# Patient Record
Sex: Female | Born: 1950 | Race: White | Hispanic: No | Smoking: Never smoker
Health system: Southern US, Community
[De-identification: ages and names within clinical notes are randomized; demographics above are authoritative.]

## PROBLEM LIST (undated history)

## (undated) DIAGNOSIS — H269 Unspecified cataract: Secondary | ICD-10-CM

## (undated) DIAGNOSIS — G610 Guillain-Barre syndrome: Secondary | ICD-10-CM

## (undated) DIAGNOSIS — R569 Unspecified convulsions: Secondary | ICD-10-CM

## (undated) DIAGNOSIS — F0390 Unspecified dementia without behavioral disturbance: Secondary | ICD-10-CM

## (undated) DIAGNOSIS — F419 Anxiety disorder, unspecified: Secondary | ICD-10-CM

## (undated) HISTORY — PX: EYE SURGERY: SHX253

## (undated) HISTORY — PX: TUBAL LIGATION: SHX77

## (undated) HISTORY — DX: Anxiety disorder, unspecified: F41.9

## (undated) HISTORY — DX: Unspecified convulsions: R56.9

## (undated) HISTORY — DX: Guillain-Barre syndrome: G61.0

## (undated) HISTORY — DX: Unspecified cataract: H26.9

## (undated) HISTORY — PX: HIP SURGERY: SHX245

---

## 2020-04-16 ENCOUNTER — Other Ambulatory Visit: Payer: Self-pay

## 2020-04-17 ENCOUNTER — Ambulatory Visit (INDEPENDENT_AMBULATORY_CARE_PROVIDER_SITE_OTHER): Payer: Medicare Other | Admitting: Family Medicine

## 2020-04-17 ENCOUNTER — Encounter: Payer: Self-pay | Admitting: Family Medicine

## 2020-04-17 VITALS — BP 140/72 | HR 121 | Temp 98.0°F | Ht 64.0 in | Wt <= 1120 oz

## 2020-04-17 DIAGNOSIS — G309 Alzheimer's disease, unspecified: Secondary | ICD-10-CM | POA: Diagnosis not present

## 2020-04-17 DIAGNOSIS — F419 Anxiety disorder, unspecified: Secondary | ICD-10-CM | POA: Diagnosis not present

## 2020-04-17 DIAGNOSIS — G610 Guillain-Barre syndrome: Secondary | ICD-10-CM

## 2020-04-17 DIAGNOSIS — F0280 Dementia in other diseases classified elsewhere without behavioral disturbance: Secondary | ICD-10-CM

## 2020-04-17 DIAGNOSIS — I10 Essential (primary) hypertension: Secondary | ICD-10-CM

## 2020-04-17 DIAGNOSIS — R000 Tachycardia, unspecified: Secondary | ICD-10-CM

## 2020-04-17 DIAGNOSIS — G6181 Chronic inflammatory demyelinating polyneuritis: Secondary | ICD-10-CM | POA: Insufficient documentation

## 2020-04-17 DIAGNOSIS — G40909 Epilepsy, unspecified, not intractable, without status epilepticus: Secondary | ICD-10-CM

## 2020-04-17 MED ORDER — LISINOPRIL 10 MG PO TABS: 5.0000 mg | ORAL_TABLET | Freq: Every day | ORAL | 1 refills | Status: DC

## 2020-04-17 MED ORDER — PAROXETINE HCL 10 MG PO TABS: 10.0000 mg | ORAL_TABLET | Freq: Every day | ORAL | 1 refills | Status: DC

## 2020-04-17 NOTE — Progress Notes (Signed)
New Patient Office Visit  Subjective:  Patient ID: Gabrielle Berry, female    DOB: 12/28/1950  Age: 69 y.o. MRN: [id number]  CC:  Chief Complaint  Patient presents with  . Hospitalization Follow-up    follow up from hospital for seizures. Would like to start on anxiety medications.     HPI Gabrielle Berry presents for establishment of care.  Patient is from [facility] and was hospitalized for a seizure about 2 weeks ago.  She presents today with her daughter who has bought her up to this area to live with her.  She has a longstanding history of seizure disorder since childhood, diagnosed with Alzheimer's dementia 2 years ago.  History of mild hypertension.  History of anxiety.  Daughter says that her heart rate elevates when she is anxious.  Patient had been living independently with a significant other who is blind and has end-stage congestive heart failure.  Patient does not smoke, drink alcohol or use illicit drugs.  Patient had developed blood clots in her arms during her hospitalization and was discharged on 5 mg of apixaban twice daily for a month.  She has been taking donepezi donepezill 5mg  daily for 2 years.  She takes phenytoin 100 mg 3 times daily.  Daughter will be supervising patient's medication administration.  Daughter also assures me the patient has been eating normally and accepting Ensure.  Past Medical History:  Diagnosis Date  . Anxiety   . Cataract   . Seizures (HCC)     Past Surgical History:  Procedure Laterality Date  . EYE SURGERY    . HIP SURGERY Right   . TUBAL LIGATION      Family History  Problem Relation Age of Onset  . Hypertension Mother   . Anxiety disorder Mother   . Diabetes Father     Social History   Socioeconomic History  . Marital status: Widowed    Spouse name: Not on file  . Number of children: Not on file  . Years of education: Not on file  . Highest education level: Not on file  Occupational History  . Not on file  Tobacco Use    . Smoking status: Never Smoker  . Smokeless tobacco: Never Used  Substance and Sexual Activity  . Alcohol use: Never  . Drug use: Never  . Sexual activity: Not on file  Other Topics Concern  . Not on file  Social History Narrative  . Not on file   Social Determinants of Health   Financial Resource Strain:   . Difficulty of Paying Living Expenses:   Food Insecurity:   . Worried About in the Last Year:   . Programme researcher, broadcasting/film/video in the Last Year:   Transportation Needs:   . Barista (Medical):   Freight forwarder Lack of Transportation (Non-Medical):   Physical Activity:   . Days of Exercise per Week:   . Minutes of Exercise per Session:   Stress:   . Feeling of Stress :   Social Connections:   . Frequency of Communication with Friends and Family:   . Frequency of Social Gatherings with Friends and Family:   . Attends Religious Services:   . Active Member of Clubs or Organizations:   . Attends Marland Kitchen Meetings:   Banker Marital Status:   Intimate Partner Violence:   . Fear of Current or Ex-Partner:   . Emotionally Abused:   Marland Kitchen Physically Abused:   . Sexually Abused:  ROS Review of Systems  Constitutional: Negative for chills, diaphoresis, fatigue, fever and unexpected weight change.  HENT: Negative.   Eyes: Negative for photophobia and visual disturbance.  Respiratory: Negative for shortness of breath and wheezing.   Cardiovascular: Negative.   Gastrointestinal: Negative.   Genitourinary: Negative.   Musculoskeletal: Positive for gait problem.  Skin: Negative for pallor and rash.  Allergic/Immunologic: Negative for immunocompromised state.  Neurological: Positive for seizures. Negative for speech difficulty.  Hematological: Does not bruise/bleed easily.  Psychiatric/Behavioral: Positive for confusion. Negative for behavioral problems. The patient is nervous/anxious.     Objective:   Today's Vitals: BP 140/72   Pulse (!) 121   Temp 98  F (36.7 C) (Tympanic)   Ht 5\' 4"  (1.626 m)   Wt 70 lb (31.8 kg)   LMP  (LMP Unknown)   SpO2 98%   BMI 12.02 kg/m   Physical Exam Vitals and nursing note reviewed.  Constitutional:      General: She is not in acute distress.    Appearance: She is underweight. She is ill-appearing. She is not toxic-appearing or diaphoretic.  HENT:     Head: Atraumatic.     Right Ear: External ear normal.     Left Ear: External ear normal.  Eyes:     General: No scleral icterus.       Right eye: No discharge.        Left eye: No discharge.     Conjunctiva/sclera: Conjunctivae normal.  Cardiovascular:     Rate and Rhythm: Regular rhythm. Tachycardia present.  Pulmonary:     Effort: Pulmonary effort is normal.     Breath sounds: Normal breath sounds.  Abdominal:     General: Bowel sounds are normal.  Musculoskeletal:     Cervical back: No rigidity or tenderness.     Right lower leg: No edema.     Left lower leg: No edema.  Lymphadenopathy:     Cervical: No cervical adenopathy.  Skin:    Coloration: Skin is not jaundiced.  Neurological:     Mental Status: She is alert.  Psychiatric:        Mood and Affect: Mood normal.        Behavior: Behavior normal.     Assessment & Plan:   Problem List Items Addressed This Visit    None    Visit Diagnoses    Seizure disorder (HCC)    -  Primary   Relevant Orders   CBC   Comprehensive metabolic panel   Dilantin (Phenytoin) level, total   Alzheimer's dementia without behavioral disturbance, unspecified timing of dementia onset (HCC)       Relevant Medications   PARoxetine (PAXIL) 10 MG tablet   Anxiety       Relevant Medications   PARoxetine (PAXIL) 10 MG tablet   Essential hypertension       Relevant Medications   PARoxetine (PAXIL) 10 MG tablet   lisinopril (PRINIVIL) 10 MG tablet   Other Relevant Orders   CBC   Comprehensive metabolic panel   Tachycardia       Relevant Orders   TSH      Outpatient Encounter Medications as of  04/17/2020  Medication Sig  . lisinopril (PRINIVIL) 10 MG tablet Take 0.5 tablets (5 mg total) by mouth daily.  06/17/2020 PARoxetine (PAXIL) 10 MG tablet Take 1 tablet (10 mg total) by mouth daily.   No facility-administered encounter medications on file as of 04/17/2020.    Follow-up: Return in  about 1 month (around 05/18/2020).   Libby Maw, MD

## 2020-04-17 NOTE — Addendum Note (Signed)
Addended by: Varney Biles on: 04/17/2020 01:40 PM   Modules accepted: Orders

## 2020-04-23 ENCOUNTER — Other Ambulatory Visit: Payer: Self-pay

## 2020-04-23 ENCOUNTER — Encounter: Payer: Self-pay | Admitting: Family

## 2020-04-23 ENCOUNTER — Ambulatory Visit (INDEPENDENT_AMBULATORY_CARE_PROVIDER_SITE_OTHER): Payer: Medicare Other | Admitting: Family

## 2020-04-23 VITALS — BP 160/80 | HR 97 | Temp 98.2°F | Ht 64.0 in | Wt <= 1120 oz

## 2020-04-23 DIAGNOSIS — L03113 Cellulitis of right upper limb: Secondary | ICD-10-CM | POA: Diagnosis not present

## 2020-04-23 MED ORDER — SULFAMETHOXAZOLE-TRIMETHOPRIM 800-160 MG PO TABS
1.0000 | ORAL_TABLET | Freq: Two times a day (BID) | ORAL | 0 refills | Status: DC
Start: 2020-04-23 — End: 2020-05-08

## 2020-04-23 NOTE — Patient Instructions (Signed)

## 2020-04-23 NOTE — Progress Notes (Signed)
Acute Office Visit  Subjective:    Patient ID: Gabrielle Berry, female    DOB: February 12, 1951, 69 y.o.   MRN: [id number]  Chief Complaint  Patient presents with  . Arm Pain    Pt c/o pain in her rt arm.  She recently had an IV in that arm.  Sore to touch, daughter noticed last night.    HPI Patient is in today with c/o redness, swelling and warmth to the right arm where she had an IV in the hospital. She spent 2 weeks in the hospital. Home Health suggested she be taken to the doctor to have it checked. Her daughter has been applying Bacitracin to the area. It has become sore to touch.   Past Medical History:  Diagnosis Date  . Anxiety   . Cataract   . Seizures (HCC)     Past Surgical History:  Procedure Laterality Date  . EYE SURGERY    . HIP SURGERY Right   . TUBAL LIGATION      Family History  Problem Relation Age of Onset  . Hypertension Mother   . Anxiety disorder Mother   . Diabetes Father     Social History   Socioeconomic History  . Marital status: Widowed    Spouse name: Not on file  . Number of children: Not on file  . Years of education: Not on file  . Highest education level: Not on file  Occupational History  . Not on file  Tobacco Use  . Smoking status: Never Smoker  . Smokeless tobacco: Never Used  Substance and Sexual Activity  . Alcohol use: Never  . Drug use: Never  . Sexual activity: Not on file  Other Topics Concern  . Not on file  Social History Narrative  . Not on file   Social Determinants of Health   Financial Resource Strain:   . Difficulty of Paying Living Expenses:   Food Insecurity:   . Worried About Programme researcher, broadcasting/film/video in the Last Year:   . Barista in the Last Year:   Transportation Needs:   . Freight forwarder (Medical):   Marland Kitchen Lack of Transportation (Non-Medical):   Physical Activity:   . Days of Exercise per Week:   . Minutes of Exercise per Session:   Stress:   . Feeling of Stress :   Social Connections:     . Frequency of Communication with Friends and Family:   . Frequency of Social Gatherings with Friends and Family:   . Attends Religious Services:   . Active Member of Clubs or Organizations:   . Attends Banker Meetings:   Marland Kitchen Marital Status:   Intimate Partner Violence:   . Fear of Current or Ex-Partner:   . Emotionally Abused:   Marland Kitchen Physically Abused:   . Sexually Abused:     Outpatient Medications Prior to Visit  Medication Sig Dispense Refill  . apixaban (ELIQUIS) 5 MG TABS tablet Take 5 mg by mouth 2 (two) times daily.    Marland Kitchen donepezil (ARICEPT) 5 MG tablet Take 5 mg by mouth at bedtime.    Marland Kitchen lisinopril (PRINIVIL) 10 MG tablet Take 0.5 tablets (5 mg total) by mouth daily. 30 tablet 1  . PARoxetine (PAXIL) 10 MG tablet Take 1 tablet (10 mg total) by mouth daily. 30 tablet 1  . phenytoin (DILANTIN) 100 MG ER capsule Take by mouth 3 (three) times daily.     No facility-administered medications prior to visit.  Allergies  Allergen Reactions  . Penicillins     Review of Systems  Constitutional: Negative.   Respiratory: Negative.   Cardiovascular: Negative.   Skin: Positive for wound.       Wound to the right arm that is swollen, red, and tender to touch  Psychiatric/Behavioral: Negative.        Objective:    Physical Exam Vitals reviewed.  Constitutional:      Comments: underweight  Cardiovascular:     Rate and Rhythm: Normal rate and regular rhythm.  Pulmonary:     Effort: Pulmonary effort is normal.     Breath sounds: Normal breath sounds.  Skin:      Neurological:     Mental Status: She is alert.     BP (!) 160/80 (BP Location: Left Arm, Patient Position: Sitting, Cuff Size: Small)   Pulse 97   Temp 98.2 F (36.8 C) (Temporal)   Ht 5\' 4"  (1.626 m)   Wt 68 lb 9.6 oz (31.1 kg)   LMP  (LMP Unknown)   SpO2 97%   BMI 11.78 kg/m  Wt Readings from Last 3 Encounters:  04/23/20 68 lb 9.6 oz (31.1 kg)  04/17/20 70 lb (31.8 kg)    Health  Maintenance Due  Topic Date Due  . Hepatitis C Screening  Never done  . COVID-19 Vaccine (1) Never done  . TETANUS/TDAP  Never done  . MAMMOGRAM  Never done  . COLONOSCOPY  Never done  . DEXA SCAN  Never done  . PNA vac Low Risk Adult (1 of 2 - PCV13) Never done        Assessment & Plan:   Problem List Items Addressed This Visit    None    Visit Diagnoses    Cellulitis of right upper extremity    -  Primary      Gabrielle Berry was seen today for arm pain.  Diagnoses and all orders for this visit:  Cellulitis of right upper extremity  Other orders -     sulfamethoxazole-trimethoprim (BACTRIM DS) 800-160 MG tablet; Take 1 tablet by mouth 2 (two) times daily.  Call the office with any questions or concerns. Recheck as scheduled and sooner as needed   Kennyth Arnold, FNP

## 2020-04-24 ENCOUNTER — Telehealth: Payer: Self-pay | Admitting: Family Medicine

## 2020-04-24 NOTE — Telephone Encounter (Signed)
Dr. Doreene Burke is it okay for verbal orders for wound care a home nurse calling from [facility]? Please advise.

## 2020-04-24 NOTE — Telephone Encounter (Signed)
Gabrielle Berry is calling and stated that she went to see patient yesterday and needed womb care orders and if Dr. Doreene Burke could give verbal for home health care. CB is [phone]

## 2020-04-25 NOTE — Telephone Encounter (Signed)
Okay wound care.

## 2020-04-25 NOTE — Telephone Encounter (Signed)
Returned Angel's call for verbal orders, no answer LMTCB

## 2020-04-30 NOTE — Telephone Encounter (Signed)
Tried [facility] manager regarding orders for patient, no answer LMTCB

## 2020-05-08 ENCOUNTER — Telehealth: Payer: Self-pay | Admitting: Family Medicine

## 2020-05-08 MED ORDER — SULFAMETHOXAZOLE-TRIMETHOPRIM 800-160 MG PO TABS: 1.0000 | ORAL_TABLET | Freq: Two times a day (BID) | ORAL | 0 refills | Status: DC

## 2020-05-08 NOTE — Telephone Encounter (Signed)
Refill sent in called daughter to inform.

## 2020-05-08 NOTE — Telephone Encounter (Signed)
Patient's daughter called and said the infection on her arm has gotten better but not quite cleared up. She is requesting a refill of the Bactrim antibiotics.

## 2020-05-11 ENCOUNTER — Telehealth: Payer: Self-pay | Admitting: Family Medicine

## 2020-05-11 NOTE — Telephone Encounter (Addendum)
Patient daughter is calling and requesting a refill for donepezil sent to [facility] on [address]  CB is [phone]

## 2020-05-15 ENCOUNTER — Other Ambulatory Visit: Payer: Self-pay

## 2020-05-15 MED ORDER — DONEPEZIL HCL 5 MG PO TABS: 5.0000 mg | ORAL_TABLET | Freq: Every day | ORAL | 2 refills | Status: DC

## 2020-05-15 NOTE — Telephone Encounter (Signed)
Last OV 04/23/20 Last fill Historical Provider

## 2020-05-15 NOTE — Telephone Encounter (Signed)
Request sent to Dr. Kremer. 

## 2020-05-16 NOTE — Telephone Encounter (Signed)
Patient daughter is calling back to check the status of medication refill. CB is [phone].

## 2020-05-17 ENCOUNTER — Other Ambulatory Visit: Payer: Self-pay

## 2020-05-17 NOTE — Telephone Encounter (Signed)
Spoke with patients daughter who verbally understood Rx sent to pharmacy.

## 2020-05-18 ENCOUNTER — Ambulatory Visit (INDEPENDENT_AMBULATORY_CARE_PROVIDER_SITE_OTHER): Payer: Medicare Other | Admitting: Family Medicine

## 2020-05-18 ENCOUNTER — Encounter: Payer: Self-pay | Admitting: Family Medicine

## 2020-05-18 VITALS — BP 124/68 | HR 75 | Temp 97.6°F | Ht 64.0 in | Wt <= 1120 oz

## 2020-05-18 DIAGNOSIS — R000 Tachycardia, unspecified: Secondary | ICD-10-CM

## 2020-05-18 DIAGNOSIS — F0280 Dementia in other diseases classified elsewhere without behavioral disturbance: Secondary | ICD-10-CM

## 2020-05-18 DIAGNOSIS — G40909 Epilepsy, unspecified, not intractable, without status epilepticus: Secondary | ICD-10-CM

## 2020-05-18 DIAGNOSIS — I10 Essential (primary) hypertension: Secondary | ICD-10-CM | POA: Diagnosis not present

## 2020-05-18 DIAGNOSIS — R569 Unspecified convulsions: Secondary | ICD-10-CM | POA: Insufficient documentation

## 2020-05-18 DIAGNOSIS — G309 Alzheimer's disease, unspecified: Secondary | ICD-10-CM | POA: Insufficient documentation

## 2020-05-18 DIAGNOSIS — G610 Guillain-Barre syndrome: Secondary | ICD-10-CM

## 2020-05-18 DIAGNOSIS — Z8669 Personal history of other diseases of the nervous system and sense organs: Secondary | ICD-10-CM

## 2020-05-18 DIAGNOSIS — F419 Anxiety disorder, unspecified: Secondary | ICD-10-CM

## 2020-05-18 DIAGNOSIS — F02818 Alzheimer's disease, unspecified: Secondary | ICD-10-CM | POA: Insufficient documentation

## 2020-05-18 MED ORDER — PAROXETINE HCL 10 MG PO TABS: 10.0000 mg | ORAL_TABLET | Freq: Every day | ORAL | 1 refills | Status: DC

## 2020-05-18 MED ORDER — LISINOPRIL 10 MG PO TABS: 5.0000 mg | ORAL_TABLET | Freq: Every day | ORAL | 1 refills | Status: DC

## 2020-05-18 NOTE — Addendum Note (Signed)
Addended by: Varney Biles on: 05/18/2020 12:01 PM   Modules accepted: Orders

## 2020-05-18 NOTE — Patient Instructions (Addendum)

## 2020-05-18 NOTE — Addendum Note (Signed)
Addended by: Varney Biles on: 05/18/2020 11:46 AM   Modules accepted: Orders

## 2020-05-18 NOTE — Progress Notes (Signed)
Established Patient Office Visit  Subjective:  Patient ID: Gabrielle Berry, female    DOB: 1951/10/03  Age: 69 y.o. MRN: [id number]  CC:  Chief Complaint  Patient presents with   Follow-up    follow up would like referral to neurogist guillain barre    HPI Gabrielle Berry presents for follow-up.  Doing better.  Excepting increased nutrition.  Energy level is improved.  Using wheelchair for long distances only.  Anxiety is improved with the Paxil.  Has finished her course of therapy with Eliquis.  Past Medical History:  Diagnosis Date   Anxiety    Cataract    Guillain Barr syndrome ([place])    Seizures ([place])     Past Surgical History:  Procedure Laterality Date   EYE SURGERY     HIP SURGERY Right    TUBAL LIGATION      Family History  Problem Relation Age of Onset   Hypertension Mother    Anxiety disorder Mother    Diabetes Father     Social History   Socioeconomic History   Marital status: Widowed    Spouse name: Not on file   Number of children: Not on file   Years of education: Not on file   Highest education level: Not on file  Occupational History   Not on file  Tobacco Use   Smoking status: Never Smoker   Smokeless tobacco: Never Used  Substance and Sexual Activity   Alcohol use: Never   Drug use: Never   Sexual activity: Not on file  Other Topics Concern   Not on file  Social History Narrative   Not on file   Social Determinants of Health   Financial Resource Strain:    Difficulty of Paying Living Expenses:   Food Insecurity:    Worried About [place] in the Last Year:    Arboriculturist in the Last Year:   Transportation Needs:    Film/video editor (Medical):    Lack of Transportation (Non-Medical):   Physical Activity:    Days of Exercise per Week:    Minutes of Exercise per Session:   Stress:    Feeling of Stress :   Social Connections:    Frequency of Communication with Friends and Family:      Frequency of Social Gatherings with Friends and Family:    Attends Religious Services:    Active Member of Clubs or Organizations:    Attends Music therapist:    Marital Status:   Intimate Partner Violence:    Fear of Current or Ex-Partner:    Emotionally Abused:    Physically Abused:    Sexually Abused:     Outpatient Medications Prior to Visit  Medication Sig Dispense Refill   donepezil (ARICEPT) 5 MG tablet Take 1 tablet (5 mg total) by mouth at bedtime. 30 tablet 2   phenytoin (DILANTIN) 100 MG ER capsule Take by mouth 3 (three) times daily.     lisinopril (PRINIVIL) 10 MG tablet Take 0.5 tablets (5 mg total) by mouth daily. 30 tablet 1   PARoxetine (PAXIL) 10 MG tablet Take 1 tablet (10 mg total) by mouth daily. 30 tablet 1   apixaban (ELIQUIS) 5 MG TABS tablet Take 5 mg by mouth 2 (two) times daily.     sulfamethoxazole-trimethoprim (BACTRIM DS) 800-160 MG tablet Take 1 tablet by mouth 2 (two) times daily. (Patient not taking: Reported on 05/18/2020) 14 tablet 0   No facility-administered  medications prior to visit.    Allergies  Allergen Reactions   Penicillins     ROS Review of Systems  Constitutional: Negative.   Respiratory: Negative.   Cardiovascular: Negative.   Gastrointestinal: Negative.   Genitourinary: Negative.   Neurological: Negative for speech difficulty.  Hematological: Does not bruise/bleed easily.  Psychiatric/Behavioral: Positive for confusion.      Objective:    Physical Exam  Constitutional: She appears cachectic.  HENT:  Head: Normocephalic and atraumatic.  Right Ear: External ear normal.  Left Ear: External ear normal.  Eyes: Pupils are equal, round, and reactive to light. Conjunctivae and EOM are normal.  Neck: Trachea normal.  Cardiovascular: Regular rhythm.  Murmur heard.  Systolic murmur is present. Pulmonary/Chest: Effort normal and breath sounds normal.  Musculoskeletal:     Cervical back: Neck  supple.     Right lower leg: No edema.     Left lower leg: No edema.  Neurological: She is alert.  Oriented to person     BP 124/68    Pulse 75    Temp 97.6 F (36.4 C) (Tympanic)    Ht 5' 4" (1.626 m)    Wt 69 lb 11.2 oz (31.6 kg)    LMP  (LMP Unknown)    SpO2 94%    BMI 11.96 kg/m  Wt Readings from Last 3 Encounters:  05/18/20 69 lb 11.2 oz (31.6 kg)  04/23/20 68 lb 9.6 oz (31.1 kg)  04/17/20 70 lb (31.8 kg)     Health Maintenance Due  Topic Date Due   Hepatitis C Screening  Never done   COVID-19 Vaccine (1) Never done   TETANUS/TDAP  Never done   MAMMOGRAM  Never done   COLONOSCOPY  Never done   DEXA SCAN  Never done   PNA vac Low Risk Adult (1 of 2 - PCV13) Never done    There are no preventive care reminders to display for this patient.  No results found for: TSH No results found for: WBC, HGB, HCT, MCV, PLT No results found for: NA, K, CHLORIDE, CO2, GLUCOSE, BUN, CREATININE, BILITOT, ALKPHOS, AST, ALT, PROT, ALBUMIN, CALCIUM, ANIONGAP, EGFR, GFR No results found for: CHOL No results found for: HDL No results found for: LDLCALC No results found for: TRIG No results found for: CHOLHDL No results found for: HGBA1C    Assessment & Plan:   Problem List Items Addressed This Visit      Cardiovascular and Mediastinum   Essential hypertension   Relevant Medications   lisinopril (PRINIVIL) 10 MG tablet     Nervous and Auditory   Seizure disorder ([place]) - Primary   Relevant Orders   Ambulatory referral to Neurology   Alzheimer's dementia without behavioral disturbance ([place])   Relevant Medications   PARoxetine (PAXIL) 10 MG tablet   Other Relevant Orders   Ambulatory referral to Neurology     Other   Anxiety   Relevant Medications   PARoxetine (PAXIL) 10 MG tablet   History of Guillain-Barre syndrome   Relevant Orders   Ambulatory referral to Neurology      Meds ordered this encounter  Medications   lisinopril (PRINIVIL) 10 MG tablet    Sig:  Take 0.5 tablets (5 mg total) by mouth daily.    Dispense:  90 tablet    Refill:  1   PARoxetine (PAXIL) 10 MG tablet    Sig: Take 1 tablet (10 mg total) by mouth daily.    Dispense:  90 tablet  Refill:  1  ° ° °Follow-up: Return in about 3 months (around 08/18/2020), or try to gain some weight..  °Has a to increase Aricept today as patient continues to try to gain some point.  Was given information on high-protein high-calorie diet that can help her. ° ° Alfred , MD °

## 2020-05-22 ENCOUNTER — Encounter: Payer: Self-pay | Admitting: Neurology

## 2020-06-11 ENCOUNTER — Other Ambulatory Visit: Payer: Self-pay | Admitting: Family Medicine

## 2020-06-11 MED ORDER — PHENYTOIN SODIUM EXTENDED 100 MG PO CAPS: 100.0000 mg | ORAL_CAPSULE | Freq: Three times a day (TID) | ORAL | 0 refills | Status: DC

## 2020-06-11 NOTE — Telephone Encounter (Signed)
Patient needs refill of Dilantin, please send to same [facility] in [place].

## 2020-06-11 NOTE — Telephone Encounter (Signed)
Refill request for pending medication, last visit 05/18/20. Please advise.

## 2020-06-12 ENCOUNTER — Telehealth: Payer: Self-pay | Admitting: Family Medicine

## 2020-06-12 NOTE — Telephone Encounter (Signed)
Attempted to schedule AWV. Unable to LVM.  Will try at later time.  

## 2020-06-27 ENCOUNTER — Other Ambulatory Visit: Payer: Self-pay

## 2020-06-27 ENCOUNTER — Other Ambulatory Visit (INDEPENDENT_AMBULATORY_CARE_PROVIDER_SITE_OTHER): Payer: Medicare Other

## 2020-06-27 DIAGNOSIS — F419 Anxiety disorder, unspecified: Secondary | ICD-10-CM | POA: Diagnosis not present

## 2020-06-27 DIAGNOSIS — F0280 Dementia in other diseases classified elsewhere without behavioral disturbance: Secondary | ICD-10-CM

## 2020-06-27 DIAGNOSIS — R000 Tachycardia, unspecified: Secondary | ICD-10-CM

## 2020-06-27 DIAGNOSIS — G610 Guillain-Barre syndrome: Secondary | ICD-10-CM

## 2020-06-27 DIAGNOSIS — G309 Alzheimer's disease, unspecified: Secondary | ICD-10-CM

## 2020-06-27 DIAGNOSIS — Z8669 Personal history of other diseases of the nervous system and sense organs: Secondary | ICD-10-CM

## 2020-06-27 DIAGNOSIS — G40909 Epilepsy, unspecified, not intractable, without status epilepticus: Secondary | ICD-10-CM

## 2020-06-27 DIAGNOSIS — I10 Essential (primary) hypertension: Secondary | ICD-10-CM

## 2020-06-27 LAB — COMPREHENSIVE METABOLIC PANEL
ALT: 14 U/L (ref 0–35)
AST: 19 U/L (ref 0–37)
Albumin: 4.4 g/dL (ref 3.5–5.2)
Alkaline Phosphatase: 85 U/L (ref 39–117)
BUN: 14 mg/dL (ref 6–23)
CO2: 22 mEq/L (ref 19–32)
Calcium: 9.6 mg/dL (ref 8.4–10.5)
Chloride: 103 mEq/L (ref 96–112)
Creatinine, Ser: 0.82 mg/dL (ref 0.40–1.20)
GFR: 68.67 mL/min (ref >60.00)
Glucose, Bld: 99 mg/dL (ref 70–99)
Potassium: 4.2 mEq/L (ref 3.5–5.1)
Sodium: 138 mEq/L (ref 135–145)
Total Bilirubin: 0.4 mg/dL (ref 0.2–1.2)
Total Protein: 7.3 g/dL (ref 6.0–8.3)

## 2020-06-27 LAB — CBC
HCT: 41.1 % (ref 36.0–46.0)
Hemoglobin: 13.6 g/dL (ref 12.0–15.0)
MCHC: 33 g/dL (ref 30.0–36.0)
MCV: 99.5 fl (ref 78.0–100.0)
Platelets: 198 10*3/uL (ref 150.0–400.0)
RBC: 4.13 Mil/uL (ref 3.87–5.11)
RDW: 13.4 % (ref 11.5–15.5)
WBC: 6.9 10*3/uL (ref 4.0–10.5)

## 2020-06-27 LAB — TSH: TSH: 1.38 u[IU]/mL (ref 0.35–4.50)

## 2020-06-28 LAB — PHENYTOIN LEVEL, TOTAL: Phenytoin, Total: 5.6 mg/L — ABNORMAL LOW (ref 10.0–20.0)

## 2020-07-12 ENCOUNTER — Other Ambulatory Visit: Payer: Self-pay | Admitting: Family Medicine

## 2020-07-12 ENCOUNTER — Telehealth: Payer: Self-pay | Admitting: Family Medicine

## 2020-07-12 NOTE — Telephone Encounter (Signed)
Returned call no answer LMTCB 

## 2020-07-12 NOTE — Telephone Encounter (Signed)
Patient is calling back regarding patients labs, please advise. CB is [phone].

## 2020-08-13 ENCOUNTER — Other Ambulatory Visit: Payer: Self-pay | Admitting: Family Medicine

## 2020-08-27 ENCOUNTER — Ambulatory Visit (INDEPENDENT_AMBULATORY_CARE_PROVIDER_SITE_OTHER): Payer: Medicare Other | Admitting: Neurology

## 2020-08-27 ENCOUNTER — Other Ambulatory Visit: Payer: Self-pay

## 2020-08-27 ENCOUNTER — Encounter: Payer: Self-pay | Admitting: Neurology

## 2020-08-27 VITALS — BP 128/89 | HR 84 | Ht 64.0 in | Wt 74.6 lb

## 2020-08-27 DIAGNOSIS — F0280 Dementia in other diseases classified elsewhere without behavioral disturbance: Secondary | ICD-10-CM

## 2020-08-27 DIAGNOSIS — G301 Alzheimer's disease with late onset: Secondary | ICD-10-CM

## 2020-08-27 DIAGNOSIS — G40309 Generalized idiopathic epilepsy and epileptic syndromes, not intractable, without status epilepticus: Secondary | ICD-10-CM | POA: Diagnosis not present

## 2020-08-27 DIAGNOSIS — Z8669 Personal history of other diseases of the nervous system and sense organs: Secondary | ICD-10-CM

## 2020-08-27 MED ORDER — DONEPEZIL HCL 5 MG PO TABS: 5.0000 mg | ORAL_TABLET | Freq: Every day | ORAL | 3 refills | Status: DC

## 2020-08-27 MED ORDER — PHENYTOIN SODIUM EXTENDED 100 MG PO CAPS: ORAL_CAPSULE | ORAL | 3 refills | Status: DC

## 2020-08-27 NOTE — Progress Notes (Signed)
NEUROLOGY CONSULTATION NOTE  Gabrielle Berry MRN: [id number] DOB: [birth year]  Referring provider: Dr. Nadene Rubins Primary care provider: Dr. Nadene Rubins  Reason for consult:  Seizure disorder, Alzheimer's dementia, history of GBS  Dear Dr Doreene Burke:  Thank you for your kind referral of Gabrielle Berry for consultation of the above symptoms. Although her history is well known to you, please allow me to reiterate it for the purpose of our medical record. The patient was accompanied to the clinic by her daughter [name] who also provides collateral information. Records and images were personally reviewed where available.   HISTORY OF PRESENT ILLNESS: This is a pleasant 69 year old left-handed woman with a history of hypertension, dementia, epilepsy, recurrent Guillain-Barre syndrome, presenting to establish care. She moved to [place] to live with her daughter last May, after hospital admission for fever and seizure. She had been seizure-free since 1994 on Dilantin 300mg  qhs, until she had a seizure last May 2021 and was found to have non-existent Dilantin level. They found out she had not refilled it since January 2021. She had also stopped the Donepezil that time. She had been living alone in February 2021, with her nephew staying with her but not helping. She had a boyfriend she would visit daily, and denied getting lost driving to his home. He passed away recently. She was not missing bill payments, her daughter states she was not missing it because she knew she had to pay them, but when it came to taking care of herself, this was a different issue. She was forgetting to eat and 'was living off peanut butter and jelly sandwiches. She feels her memory is okay, but has word-finding difficulties. She was diagnosed with dementia around 2 years ago and has been taking Donepezil 5mg  daily without side effects. She has a history of seizures since childhood with generalized tonic-clonic seizures that were  well-controlled on Dilantin until last May. Her daughter denies any staring/unresponsive episodes. She denies any olfactory/gustatory hallucinations, myoclonic jerks. She has a history of 2 episodes of Guillain-Barre, the first occurred after a flu shot in 1997, she was intubated in [facility] and admitted for a month. She had another episode of GBS in 2000 when she started having constant itching in her feet, the diagnosis was confirmed and she received IV treatment and more physical therapy because she could not walk/use her hands or legs. She fractured her right leg at that time trying to ambulate. She has had chronic right leg pain since then. She cannot feel her feet and has paresthesias in her fingers. She denies any headaches. She gets dizzy walking sometimes. Her last fall was in April. She denies any diplopia, dysarthria/dysphagia, neck/back pain, bowel/bladder dysfunction, anosmia. She has tremors in both hands. Sleep is good. Since moving in with 2001, her daughter manages meals, finances, medications. She does not drive. She is independent with dressing and bathing. No personality changes. She had delirium in May and was hallucinating initially upon return to [facility], this has resolved. No family history of dementia. She does not drink alcohol.   Epilepsy Risk Factors:  There is a family history of seizures in her daughter (childhood seizures), granddaughter, and niece. She had febrile seizures in childhood. Otherwise she had a normal birth and early development.  There is no history of CNS infections such as meningitis/encephalitis, significant traumatic brain injury, neurosurgical procedures.   PAST MEDICAL HISTORY: Past Medical History:  Diagnosis Date  . Anxiety   . Cataract   .  Guillain Barr syndrome (HCC)   . Seizures (HCC)     PAST SURGICAL HISTORY: Past Surgical History:  Procedure Laterality Date  . EYE SURGERY    . HIP SURGERY Right   . TUBAL LIGATION       MEDICATIONS: Current Outpatient Medications on File Prior to Visit  Medication Sig Dispense Refill  . donepezil (ARICEPT) 5 MG tablet TAKE 1 TABLET (5 MG TOTAL) BY MOUTH AT BEDTIME. 30 tablet 2  . lisinopril (PRINIVIL) 10 MG tablet Take 0.5 tablets (5 mg total) by mouth daily. 90 tablet 1  . PARoxetine (PAXIL) 10 MG tablet Take 1 tablet (10 mg total) by mouth daily. 90 tablet 1  . phenytoin (DILANTIN) 100 MG ER capsule TAKE 1 CAPSULE BY MOUTH 3 TIMES DAILY. 90 capsule 0   No current facility-administered medications on file prior to visit.    ALLERGIES: Allergies  Allergen Reactions  . Penicillins     FAMILY HISTORY: Family History  Problem Relation Age of Onset  . Hypertension Mother   . Anxiety disorder Mother   . Diabetes Father     SOCIAL HISTORY: Social History   Socioeconomic History  . Marital status: Widowed    Spouse name: Not on file  . Number of children: Not on file  . Years of education: Not on file  . Highest education level: Not on file  Occupational History  . Not on file  Tobacco Use  . Smoking status: Never Smoker  . Smokeless tobacco: Never Used  Vaping Use  . Vaping Use: Never used  Substance and Sexual Activity  . Alcohol use: Never  . Drug use: Never  . Sexual activity: Not on file  Other Topics Concern  . Not on file  Social History Narrative   Left handed    Lives with daughter      Social Determinants of Health   Financial Resource Strain:   . Difficulty of Paying Living Expenses: Not on file  Food Insecurity:   . Worried About Programme researcher, broadcasting/film/video in the Last Year: Not on file  . Ran Out of Food in the Last Year: Not on file  Transportation Needs:   . Lack of Transportation (Medical): Not on file  . Lack of Transportation (Non-Medical): Not on file  Physical Activity:   . Days of Exercise per Week: Not on file  . Minutes of Exercise per Session: Not on file  Stress:   . Feeling of Stress : Not on file  Social  Connections:   . Frequency of Communication with Friends and Family: Not on file  . Frequency of Social Gatherings with Friends and Family: Not on file  . Attends Religious Services: Not on file  . Active Member of Clubs or Organizations: Not on file  . Attends Banker Meetings: Not on file  . Marital Status: Not on file  Intimate Partner Violence:   . Fear of Current or Ex-Partner: Not on file  . Emotionally Abused: Not on file  . Physically Abused: Not on file  . Sexually Abused: Not on file    PHYSICAL EXAM: Vitals:   08/27/20 0847  BP: 128/89  Pulse: 84  SpO2: 98%   General: No acute distress Head:  Normocephalic/atraumatic Skin/Extremities: No rash, no edema Neurological Exam: Mental status: alert and oriented to person, state. No dysarthria or aphasia, Fund of knowledge is appropriate.  Recent and remote memory are impaired.  Attention and concentration are reduced. SLUMS score 5/30 [employer]  Mental Exam 08/27/2020  Weekday Correct 0  Current year 0  What state are we in? 1  Amount spent 0  Amount left 0  # of Animals 0  5 objects recall 0  Number series 0  Hour markers 0  Time correct 0  Placed X in triangle correctly 1  Largest Figure 1  Name of female 2  Date back to work 0  Type of work 0  State she lived in 0  Total score 5   Cranial nerves: CN I: not tested CN II: pupils equal, round and reactive to light, visual fields intact CN III, IV, VI:  full range of motion, no nystagmus, no ptosis CN V: facial sensation intact CN VII: upper and lower face symmetric CN VIII: hearing intact to conversation Bulk & Tone: normal, no fasciculations. Motor: 5/5 on both UE, both proximal LE. 4-/5 bilateral foot dorsiflexion. Sensation: intact to light touch, cold, pin. Decreased vibration to right knee, left ankle.  Deep Tendon Reflexes: +2 throughout except for absent ankle jerks Cerebellar: no incoordination on finger to nose testing Gait:  steppage gait due to bilateral foot dorsiflexion Tremor: no resting tremor. She has bilateral high frequency low amplitude postural and endpoint tremor.   IMPRESSION: This is a pleasant 69 year old left-handed woman with a history of hypertension, dementia, epilepsy, recurrent Guillain-Barre syndrome, presenting to establish care. She has a history suggestive of primary generalized epilepsy, seizure-free from 52 until May 2021 due to medication non-compliance. She is back on Dilantin 300mg  daily, recent Dilantin level was subtherapeutic, however she has been on same dose for many years seizure-free until missing medication, would stay on same dose and re-check Dilantin level. Her neurological exam today shows bilateral mild foot drop, they report a history of recurrent GBS, records from her prior neurologist will be requested for review, advised to use walker at all times. SLUMS score today 5/30, she is unable to perform complex tasks independently and has 24/7 supervision. She is on Donepezil 5mg  daily, daughter feels she is stable on this dose, refills sent. She does not drive. Follow-up in 6 months, they know to call for any changes.   Thank you for allowing me to participate in the care of this patient. Please do not hesitate to call for any questions or concerns.   6/30, M.D.  CC: Dr. 

## 2020-08-27 NOTE — Patient Instructions (Signed)
1. Take the Dilantin 100mg : take 3 capsules every night  2. Continue Donepezil 5mg  daily  3. Records from Dr. will be requested for review  4. Have Dilantin level checked first thing in the morning when able  5. Follow-up in 6 months, call for any changes   Seizure Precautions: 1. If medication has been prescribed for you to prevent seizures, take it exactly as directed.  Do not stop taking the medicine without talking to your doctor first, even if you have not had a seizure in a long time.   2. Avoid activities in which a seizure would cause danger to yourself or to others.  Don't operate dangerous machinery, swim alone, or climb in high or dangerous places, such as on ladders, roofs, or girders.  Do not drive unless your doctor says you may.  3. If you have any warning that you may have a seizure, lay down in a safe place where you can't hurt yourself.    4.  No driving for 6 months from last seizure, as per [facility].   Please refer to the following link on the Epilepsy Foundation of America's website for more information: http://www.epilepsyfoundation.org/answerplace/Social/driving/drivingu.cfm   5.  Maintain good sleep hygiene. Avoid alcohol.  6.  Contact your doctor if you have any problems that may be related to the medicine you are taking.  7.  Call 911 and bring the patient back to the ED if:        A.  The seizure lasts longer than 5 minutes.       B.  The patient doesn't awaken shortly after the seizure  C.  The patient has new problems such as difficulty seeing, speaking or moving  D.  The patient was injured during the seizure  E.  The patient has a temperature over 102 F (39C)  F.  The patient vomited and now is having trouble breathing         FALL PRECAUTIONS: Be cautious when walking. Scan the area for obstacles that may increase the risk of trips and falls. When getting up in the mornings, sit up at the edge of the bed for a few minutes  before getting out of bed. Consider elevating the bed at the head end to avoid drop of blood pressure when getting up. Walk always in a well-lit room (use night lights in the walls). Avoid area rugs or power cords from appliances in the middle of the walkways. Use a walker or a cane if necessary and consider physical therapy for balance exercise. Get your eyesight checked regularly.   HOME SAFETY: Consider the safety of the kitchen when operating appliances like stoves, microwave oven, and blender. Consider having supervision and share cooking responsibilities until no longer able to participate in those. Accidents with firearms and other hazards in the house should be identified and addressed as well.   ABILITY TO BE LEFT ALONE: If patient is unable to contact 911 operator, consider using LifeLine, or when the need is there, arrange for someone to stay with patients. Smoking is a fire hazard, consider supervision or cessation. Risk of wandering should be assessed by caregiver and if detected at any point, supervision and safe proof recommendations should be instituted.   RECOMMENDATIONS FOR ALL PATIENTS WITH MEMORY PROBLEMS: 1. Continue to exercise (Recommend 30 minutes of walking everyday, or 3 hours every week) 2. Increase social interactions - continue going to [place] and enjoy social gatherings with friends and family 3.  Eat healthy, avoid fried foods and eat more fruits and vegetables 4. Maintain adequate blood pressure, blood sugar, and blood cholesterol level. Reducing the risk of stroke and cardiovascular disease also helps promoting better memory. 5. Avoid stressful situations. Live a simple life and avoid aggravations. Organize your time and prepare for the next day in anticipation. 6. Sleep well, avoid any interruptions of sleep and avoid any distractions in the bedroom that may interfere with adequate sleep quality 7. Avoid sugar, avoid sweets as there is a strong link between excessive  sugar intake, diabetes, and cognitive impairment The Mediterranean diet has been shown to help patients reduce the risk of progressive memory disorders and reduces cardiovascular risk. This includes eating fish, eat fruits and green leafy vegetables, nuts like almonds and hazelnuts, walnuts, and also use olive oil. Avoid fast foods and fried foods as much as possible. Avoid sweets and sugar as sugar use has been linked to worsening of memory function.  There is always a concern of gradual progression of memory problems. If this is the case, then we may need to adjust level of care according to patient needs. Support, both to the patient and caregiver, should then be put into place.

## 2020-09-12 ENCOUNTER — Telehealth: Payer: Self-pay | Admitting: Family Medicine

## 2020-09-12 ENCOUNTER — Telehealth: Payer: Self-pay | Admitting: Neurology

## 2020-09-12 NOTE — Telephone Encounter (Signed)
Records from her prior Neurologist Dr. 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 Had side effects on Keppra.

## 2020-09-12 NOTE — Telephone Encounter (Signed)
Left message for patient to schedule Annual Wellness Visit.  Please schedule with Nurse Health Advisor Martha Stanley, RN at [facility]  °

## 2020-09-18 ENCOUNTER — Ambulatory Visit: Payer: Medicare Other

## 2020-09-18 NOTE — Progress Notes (Deleted)
Subjective:   Gabrielle Berry is a 69 y.o. female who presents for an Initial Medicare Annual Wellness Visit.  Review of Systems    ***       Objective:    There were no vitals filed for this visit. There is no height or weight on file to calculate BMI.  Advanced Directives 08/27/2020  Does Patient Have a Medical Advance Directive? Yes  Type of Advance Directive Healthcare Power of Attorney    Current Medications (verified) Outpatient Encounter Medications as of 09/18/2020  Medication Sig  . donepezil (ARICEPT) 5 MG tablet Take 1 tablet (5 mg total) by mouth at bedtime.  Marland Kitchen lisinopril (PRINIVIL) 10 MG tablet Take 0.5 tablets (5 mg total) by mouth daily.  Marland Kitchen PARoxetine (PAXIL) 10 MG tablet Take 1 tablet (10 mg total) by mouth daily.  . phenytoin (DILANTIN) 100 MG ER capsule Take 3 capsules every night   No facility-administered encounter medications on file as of 09/18/2020.    Allergies (verified) Penicillins   History: Past Medical History:  Diagnosis Date  . Anxiety   . Cataract   . Guillain Barr syndrome (HCC)   . Seizures (HCC)    Past Surgical History:  Procedure Laterality Date  . EYE SURGERY    . HIP SURGERY Right   . TUBAL LIGATION     Family History  Problem Relation Age of Onset  . Hypertension Mother   . Anxiety disorder Mother   . Diabetes Father    Social History   Socioeconomic History  . Marital status: Widowed    Spouse name: Not on file  . Number of children: Not on file  . Years of education: Not on file  . Highest education level: Not on file  Occupational History  . Not on file  Tobacco Use  . Smoking status: Never Smoker  . Smokeless tobacco: Never Used  Vaping Use  . Vaping Use: Never used  Substance and Sexual Activity  . Alcohol use: Never  . Drug use: Never  . Sexual activity: Not on file  Other Topics Concern  . Not on file  Social History Narrative   Left handed    Lives with daughter      Social Determinants of  Health   Financial Resource Strain:   . Difficulty of Paying Living Expenses: Not on file  Food Insecurity:   . Worried About Programme researcher, broadcasting/film/video in the Last Year: Not on file  . Ran Out of Food in the Last Year: Not on file  Transportation Needs:   . Lack of Transportation (Medical): Not on file  . Lack of Transportation (Non-Medical): Not on file  Physical Activity:   . Days of Exercise per Week: Not on file  . Minutes of Exercise per Session: Not on file  Stress:   . Feeling of Stress : Not on file  Social Connections:   . Frequency of Communication with Friends and Family: Not on file  . Frequency of Social Gatherings with Friends and Family: Not on file  . Attends Religious Services: Not on file  . Active Member of Clubs or Organizations: Not on file  . Attends Banker Meetings: Not on file  . Marital Status: Not on file    Tobacco Counseling Counseling given: Not Answered   Clinical Intake:                 Diabetic?***         Activities of Daily Living  No flowsheet data found.  Patient Care Team: [name], MD as PCP - General (Family Medicine) Van Clines, MD as Consulting Physician (Neurology)  Indicate any recent Medical Services you may have received from other than [facility] providers in the past year (date may be approximate).     Assessment:   This is a routine wellness examination for [place].  Hearing/Vision screen No exam data present  Dietary issues and exercise activities discussed:    Goals   None    Depression Screen PHQ 2/9 Scores 04/17/2020  PHQ - 2 Score 1    Fall Risk Fall Risk  08/27/2020 04/17/2020  Falls in the past year? 1 1  Number falls in past yr: 1 0  Injury with Fall? 1 -  Risk for fall due to : Impaired balance/gait;Impaired mobility -    Any stairs in or around the home? {YES/NO:21197} If so, are there any without handrails? {YES/NO:21197} Home free of loose throw rugs in  walkways, pet beds, electrical cords, etc? {YES/NO:21197} Adequate lighting in your home to reduce risk of falls? {YES/NO:21197}  ASSISTIVE DEVICES UTILIZED TO PREVENT FALLS:  Life alert? {YES/NO:21197} Use of a cane, walker or w/c? {YES/NO:21197} Grab bars in the bathroom? {YES/NO:21197} Shower chair or bench in shower? {YES/NO:21197} Elevated toilet seat or a handicapped toilet? {YES/NO:21197}  TIMED UP AND GO:  Was the test performed? {YES/NO:21197}.  Length of time to ambulate 10 feet: *** sec.   {Appearance of UKGU:5427062}  Cognitive Function:        Immunizations  There is no immunization history on file for this patient.  {TDAP status:2101805} {Flu Vaccine status:2101806} {Pneumococcal vaccine status:2101807} {Covid-19 vaccine status:2101808}  Qualifies for Shingles Vaccine? {YES/NO:21197}  Zostavax completed {YES/NO:21197}  {Shingrix Completed?:2101804}  Screening Tests Health Maintenance  Topic Date Due  . Hepatitis C Screening  Never done  . COVID-19 Vaccine (1) Never done  . TETANUS/TDAP  Never done  . MAMMOGRAM  Never done  . COLONOSCOPY  Never done  . DEXA SCAN  Never done  . PNA vac Low Risk Adult (1 of 2 - PCV13) Never done  . INFLUENZA VACCINE  Never done    Health Maintenance  Health Maintenance Due  Topic Date Due  . Hepatitis C Screening  Never done  . COVID-19 Vaccine (1) Never done  . TETANUS/TDAP  Never done  . MAMMOGRAM  Never done  . COLONOSCOPY  Never done  . DEXA SCAN  Never done  . PNA vac Low Risk Adult (1 of 2 - PCV13) Never done  . INFLUENZA VACCINE  Never done    {Colorectal cancer screening:2101809} {Mammogram status:21018020} {Bone Density status:21018021}  Lung Cancer Screening: (Low Dose CT Chest recommended if Age 38-80 years, 30 pack-year currently smoking OR have quit w/in 15years.) {DOES NOT does:27190::"does not"} qualify.   Lung Cancer Screening Referral: ***  Additional Screening:  Hepatitis C  Screening: {DOES NOT does:27190::"does not"} qualify; Completed ***  Vision Screening: Recommended annual ophthalmology exams for early detection of glaucoma and other disorders of the eye. Is the patient up to date with their annual eye exam?  {YES/NO:21197} Who is the provider or what is the name of the office in which the patient attends annual eye exams? *** If pt is not established with a provider, would they like to be referred to a provider to establish care? {YES/NO:21197}.   Dental Screening: Recommended annual dental exams for proper oral hygiene  Community Resource Referral / Chronic Care Management: CRR required  this visit?  {YES/NO:21197}  CCM required this visit?  {YES/NO:21197}     Plan:     I have personally reviewed and noted the following in the patient's chart:   . Medical and social history . Use of alcohol, tobacco or illicit drugs  . Current medications and supplements . Functional ability and status . Nutritional status . Physical activity . Advanced directives . List of other physicians . Hospitalizations, surgeries, and ER visits in previous 12 months . Vitals . Screenings to include cognitive, depression, and falls . Referrals and appointments  In addition, I have reviewed and discussed with patient certain preventive protocols, quality metrics, and best practice recommendations. A written personalized care plan for preventive services as well as general preventive health recommendations were provided to patient.     Roanna Raider, LPN   27/01/5365   Nurse Notes: ***

## 2020-10-02 ENCOUNTER — Ambulatory Visit (INDEPENDENT_AMBULATORY_CARE_PROVIDER_SITE_OTHER): Payer: Medicare Other

## 2020-10-02 ENCOUNTER — Other Ambulatory Visit: Payer: Self-pay

## 2020-10-02 VITALS — Temp 96.7°F | Ht 64.0 in | Wt 75.8 lb

## 2020-10-02 DIAGNOSIS — Z0000 Encounter for general adult medical examination without abnormal findings: Secondary | ICD-10-CM | POA: Diagnosis not present

## 2020-10-02 DIAGNOSIS — Z1231 Encounter for screening mammogram for malignant neoplasm of breast: Secondary | ICD-10-CM | POA: Diagnosis not present

## 2020-10-02 NOTE — Progress Notes (Signed)
Subjective:   Gabrielle Berry is a 69 y.o. female who presents for an Initial Medicare Annual Wellness Visit.  Review of Systems     Cardiac Risk Factors include: advanced age (>23men, >58 women);hypertension     Objective:    Today's Vitals   10/02/20 1230 10/02/20 1236  Temp: (!) 96.7 F (35.9 C)   TempSrc: Temporal   Weight: 75 lb 12.8 oz (34.4 kg)   Height: 5\' 4"  (1.626 m)   PainSc:  4    Body mass index is 13.01 kg/m.  Advanced Directives 10/02/2020 08/27/2020  Does Patient Have a Medical Advance Directive? No Yes  Type of Advance Directive - Healthcare Power of Attorney  Does patient want to make changes to medical advance directive? No - Patient declined -    Current Medications (verified) Outpatient Encounter Medications as of 10/02/2020  Medication Sig  . donepezil (ARICEPT) 5 MG tablet Take 1 tablet (5 mg total) by mouth at bedtime.  10/04/2020 lisinopril (PRINIVIL) 10 MG tablet Take 0.5 tablets (5 mg total) by mouth daily.  Marland Kitchen PARoxetine (PAXIL) 10 MG tablet Take 1 tablet (10 mg total) by mouth daily.  . phenytoin (DILANTIN) 100 MG ER capsule Take 3 capsules every night   No facility-administered encounter medications on file as of 10/02/2020.    Allergies (verified) Penicillins   History: Past Medical History:  Diagnosis Date  . Anxiety   . Cataract   . Guillain Barr syndrome (HCC)   . Seizures (HCC)    Past Surgical History:  Procedure Laterality Date  . EYE SURGERY    . HIP SURGERY Right   . TUBAL LIGATION     Family History  Problem Relation Age of Onset  . Hypertension Mother   . Anxiety disorder Mother   . Diabetes Father    Social History   Socioeconomic History  . Marital status: Widowed    Spouse name: Not on file  . Number of children: Not on file  . Years of education: Not on file  . Highest education level: Not on file  Occupational History  . Not on file  Tobacco Use  . Smoking status: Never Smoker  . Smokeless tobacco: Never  Used  Vaping Use  . Vaping Use: Never used  Substance and Sexual Activity  . Alcohol use: Never  . Drug use: Never  . Sexual activity: Not on file  Other Topics Concern  . Not on file  Social History Narrative   Left handed    Lives with daughter      Social Determinants of Health   Financial Resource Strain: Low Risk   . Difficulty of Paying Living Expenses: Not hard at all  Food Insecurity: No Food Insecurity  . Worried About 10/04/2020 in the Last Year: Never true  . Ran Out of Food in the Last Year: Never true  Transportation Needs: No Transportation Needs  . Lack of Transportation (Medical): No  . Lack of Transportation (Non-Medical): No  Physical Activity: Inactive  . Days of Exercise per Week: 0 days  . Minutes of Exercise per Session: 0 min  Stress: No Stress Concern Present  . Feeling of Stress : Not at all  Social Connections: Socially Isolated  . Frequency of Communication with Friends and Family: Once a week  . Frequency of Social Gatherings with Friends and Family: Never  . Attends Religious Services: Never  . Active Member of Clubs or Organizations: No  . Attends Programme researcher, broadcasting/film/video  Meetings: Never  . Marital Status: Widowed    Tobacco Counseling Counseling given: Not Answered   Clinical Intake:  Pre-visit preparation completed: Yes  Pain : 0-10 Pain Score: 4  Pain Type: Chronic pain Pain Location: Leg Pain Orientation: Right Pain Onset: More than a month ago Pain Frequency: Constant     Nutritional Status: BMI <19  Underweight Nutritional Risks: None Diabetes: No  How often do you need to have someone help you when you read instructions, pamphlets, or other written materials from your doctor or pharmacy?: 1 - Never What is the last grade level you completed in school?: 10th grade  Diabetic?No  Interpreter Needed?: No  Information entered by :: Gabrielle Berry   Activities of Daily Living In your present state of  health, do you have any difficulty performing the following activities: 10/02/2020  Hearing? N  Vision? N  Difficulty concentrating or making decisions? Y  Comment takes aricept  Walking or climbing stairs? N  Dressing or bathing? N  Doing errands, shopping? Y  Comment daughter Ship brokerassist  Preparing Food and eating ? N  Using the Toilet? N  In the past six months, have you accidently leaked urine? N  Do you have problems with loss of bowel control? N  Managing your Medications? N  Managing your Finances? N  Housekeeping or managing your Housekeeping? N  Some recent data might be hidden    Patient Care Team: Mliss SaxKremer, Gabrielle Alfred, MD as PCP - General (Family Medicine) Van ClinesAquino, Gabrielle M, MD as Consulting Physician (Neurology)  Indicate any recent Medical Services you may have received from other than [facility] providers in the past year (date may be approximate).     Assessment:   This is a routine wellness examination for [place] P[place].  Hearing/Vision screen  Hearing Screening   125Hz  250Hz  500Hz  1000Hz  2000Hz  3000Hz  4000Hz  6000Hz  8000Hz   Right ear:           Left ear:           Comments: No issues  Vision Screening Comments: Last eye exam-12/2019  Dietary issues and exercise activities discussed: Current Exercise Habits: The patient does not participate in regular exercise at present, Exercise limited by: orthopedic condition(s)  Goals    . Patient Stated     Maintain or improve current health      Depression Screen PHQ 2/9 Scores 10/02/2020 04/17/2020  PHQ - 2 Score 0 1    Fall Risk Fall Risk  10/02/2020 08/27/2020 04/17/2020  Falls in the past year? 0 1 1  Number falls in past yr: 0 1 0  Injury with Fall? 0 1 -  Risk for fall due to : - Impaired balance/gait;Impaired mobility -  Follow up Falls prevention discussed - -    Any stairs in or around the home? No  Home free of loose throw rugs in walkways, pet beds, electrical cords, etc? Yes  Adequate lighting in your home to  reduce risk of falls? Yes   ASSISTIVE DEVICES UTILIZED TO PREVENT FALLS:  Life alert? No  Use of a cane, walker or w/c? Yes  Grab bars in the bathroom? Yes  Shower chair or bench in shower? No  Elevated toilet seat or a handicapped toilet? No   TIMED UP AND GO:  Was the test performed? Yes .  Length of time to ambulate 10 feet: 11 sec.   Gait slow and steady with assistive device  Cognitive Function:Patient has Dementia. Takes Aricept.     6CIT  Screen 10/02/2020  What Year? 4 points  What month? 3 points  What time? 3 points  Count back from 20 0 points  Months in reverse 4 points  Repeat phrase 10 points  Total Score 24    Immunizations  There is no immunization history on file for this patient.  TDAP status: Patient wishes to receive this vaccine today after disclosing that insurance does not cover the vaccine as a preventative vaccine.    Flu vaccine status: Unable to take vaccine due to Guillain-Barre Syndrome.  Pneumococcal vaccine status: Declined,  Education has been provided regarding the importance of this vaccine but patient still declined. Advised may receive this vaccine at local pharmacy or Health Dept. Aware to provide a copy of the vaccination record if obtained from local pharmacy or Health Dept. Verbalized acceptance and understanding.    Covid-19 vaccine status: Declined, Education has been provided regarding the importance of this vaccine but patient still declined. Advised may receive this vaccine at local pharmacy or Health Dept.or vaccine clinic. Aware to provide a copy of the vaccination record if obtained from local pharmacy or Health Dept. Verbalized acceptance and understanding.  Qualifies for Shingles Vaccine? Yes   Zostavax completed No   Shingrix Completed?: No.    Education has been provided regarding the importance of this vaccine. Patient has been advised to call insurance company to determine out of pocket expense if they have not yet  received this vaccine. Advised may also receive vaccine at local pharmacy or Health Dept. Verbalized acceptance and understanding.  Screening Tests Health Maintenance  Topic Date Due  . Hepatitis C Screening  Never done  . COVID-19 Vaccine (1) Never done  . TETANUS/TDAP  Never done  . MAMMOGRAM  Never done  . COLONOSCOPY  Never done  . DEXA SCAN  Never done  . PNA vac Low Risk Adult (1 of 2 - PCV13) Never done  . INFLUENZA VACCINE  Never done    Health Maintenance  Health Maintenance Due  Topic Date Due  . Hepatitis C Screening  Never done  . COVID-19 Vaccine (1) Never done  . TETANUS/TDAP  Never done  . MAMMOGRAM  Never done  . COLONOSCOPY  Never done  . DEXA SCAN  Never done  . PNA vac Low Risk Adult (1 of 2 - PCV13) Never done  . INFLUENZA VACCINE  Never done    Colorectal Cancer Screening: Declined at this time.  Mammogram status: Ordered today. Pt provided with contact info and advised to call to schedule appt.    Bone Density status; Unsure of last date. Awaiting records form previous provider.  Lung Cancer Screening: (Low Dose CT Chest recommended if Age 59-80 years, 30 pack-year currently smoking OR have quit w/in 15years.) does not qualify.    Additional Screening:  Hepatitis C Screening: does qualify; Discuss with PCP  Vision Screening: Recommended annual ophthalmology exams for early detection of glaucoma and other disorders of the eye. Is the patient up to date with their annual eye exam?  Yes  Who is the provider or what is the name of the office in which the patient attends annual eye exams? Last done in [place]   Dental Screening: Recommended annual dental exams for proper oral hygiene  Community Resource Referral / Chronic Care Management: CRR required this visit?  No   CCM required this visit?  No      Plan:     I have personally reviewed and noted the following in the  patient's chart:   . Medical and social history . Use of alcohol,  tobacco or illicit drugs  . Current medications and supplements . Functional ability and status . Nutritional status . Physical activity . Advanced directives . List of other physicians . Hospitalizations, surgeries, and ER visits in previous 12 months . Vitals . Screenings to include cognitive, depression, and falls . Referrals and appointments  In addition, I have reviewed and discussed with patient certain preventive protocols, quality metrics, and best practice recommendations. A written personalized care plan for preventive services as well as general preventive health recommendations were provided to patient.     Roanna Raider, Berry   [id number]  Nurse Health Advisor  Nurse Notes: None

## 2020-10-02 NOTE — Patient Instructions (Signed)
Gabrielle Berry , Thank you for taking time to come for your Medicare Wellness Visit. I appreciate your ongoing commitment to your health goals. Please review the following plan we discussed and let me know if I can assist you in the future.   Screening recommendations/referrals: Colonoscopy: Declined at this time Mammogram: Ordered today. Someone will be calling to shcedule Bone Density: Unsure of last test date. Awaiting records form previous provider. Recommended yearly ophthalmology/optometry visit for glaucoma screening and checkup Recommended yearly dental visit for hygiene and checkup  Vaccinations: Influenza vaccine: Unable to take due to Guillain-Barre Syndrome. Pneumococcal vaccine: Due-Discuss with PCP Tdap vaccine: Unsure of last date- Awaiting records from previous provider. Shingles vaccine: Discuss with pharmacy  Covid-19:Discuss with pharmacy  Advanced directives: Declined information.  Conditions/risks identified: See problem list  Next appointment: Follow up in one year for your annual wellness visit    Preventive Care 65 Years and Older, Female Preventive care refers to lifestyle choices and visits with your health care provider that can promote health and wellness. What does preventive care include?  A yearly physical exam. This is also called an annual well check.  Dental exams once or twice a year.  Routine eye exams. Ask your health care provider how often you should have your eyes checked.  Personal lifestyle choices, including:  Daily care of your teeth and gums.  Regular physical activity.  Eating a healthy diet.  Avoiding tobacco and drug use.  Limiting alcohol use.  Practicing safe sex.  Taking low-dose aspirin every day.  Taking vitamin and mineral supplements as recommended by your health care provider. What happens during an annual well check? The services and screenings done by your health care provider during your annual well check will  depend on your age, overall health, lifestyle risk factors, and family history of disease. Counseling  Your health care provider may ask you questions about your:  Alcohol use.  Tobacco use.  Drug use.  Emotional well-being.  Home and relationship well-being.  Sexual activity.  Eating habits.  History of falls.  Memory and ability to understand (cognition).  Work and work Astronomer.  Reproductive health. Screening  You may have the following tests or measurements:  Height, weight, and BMI.  Blood pressure.  Lipid and cholesterol levels. These may be checked every 5 years, or more frequently if you are over 11 years old.  Skin check.  Lung cancer screening. You may have this screening every year starting at age 35 if you have a 30-pack-year history of smoking and currently smoke or have quit within the past 15 years.  Fecal occult blood test (FOBT) of the stool. You may have this test every year starting at age 21.  Flexible sigmoidoscopy or colonoscopy. You may have a sigmoidoscopy every 5 years or a colonoscopy every 10 years starting at age 109.  Hepatitis C blood test.  Hepatitis B blood test.  Sexually transmitted disease (STD) testing.  Diabetes screening. This is done by checking your blood sugar (glucose) after you have not eaten for a while (fasting). You may have this done every 1-3 years.  Bone density scan. This is done to screen for osteoporosis. You may have this done starting at age 64.  Mammogram. This may be done every 1-2 years. Talk to your health care provider about how often you should have regular mammograms. Talk with your health care provider about your test results, treatment options, and if necessary, the need for more tests. Vaccines  Your  health care provider may recommend certain vaccines, such as:  Influenza vaccine. This is recommended every year.  Tetanus, diphtheria, and acellular pertussis (Tdap, Td) vaccine. You may need a  Td booster every 10 years.  Zoster vaccine. You may need this after age 49.  Pneumococcal 13-valent conjugate (PCV13) vaccine. One dose is recommended after age 4.  Pneumococcal polysaccharide (PPSV23) vaccine. One dose is recommended after age 43. Talk to your health care provider about which screenings and vaccines you need and how often you need them. This information is not intended to replace advice given to you by your health care provider. Make sure you discuss any questions you have with your health care provider. Document Released: 12/28/2015 Document Revised: 08/20/2016 Document Reviewed: 10/02/2015 Elsevier Interactive Patient Education  2017 [employer].  Fall Prevention in the Home Falls can cause injuries. They can happen to people of all ages. There are many things you can do to make your home safe and to help prevent falls. What can I do on the outside of my home?  Regularly fix the edges of walkways and driveways and fix any cracks.  Remove anything that might make you trip as you walk through a door, such as a raised step or threshold.  Trim any bushes or trees on the path to your home.  Use bright outdoor lighting.  Clear any walking paths of anything that might make someone trip, such as rocks or tools.  Regularly check to see if handrails are loose or broken. Make sure that both sides of any steps have handrails.  Any raised decks and porches should have guardrails on the edges.  Have any leaves, snow, or ice cleared regularly.  Use sand or salt on walking paths during winter.  Clean up any spills in your garage right away. This includes oil or grease spills. What can I do in the bathroom?  Use night lights.  Install grab bars by the toilet and in the tub and shower. Do not use towel bars as grab bars.  Use non-skid mats or decals in the tub or shower.  If you need to sit down in the shower, use a plastic, non-slip stool.  Keep the floor dry. Clean  up any water that spills on the floor as soon as it happens.  Remove soap buildup in the tub or shower regularly.  Attach bath mats securely with double-sided non-slip rug tape.  Do not have throw rugs and other things on the floor that can make you trip. What can I do in the bedroom?  Use night lights.  Make sure that you have a light by your bed that is easy to reach.  Do not use any sheets or blankets that are too big for your bed. They should not hang down onto the floor.  Have a firm chair that has side arms. You can use this for support while you get dressed.  Do not have throw rugs and other things on the floor that can make you trip. What can I do in the kitchen?  Clean up any spills right away.  Avoid walking on wet floors.  Keep items that you use a lot in easy-to-reach places.  If you need to reach something above you, use a strong step stool that has a grab bar.  Keep electrical cords out of the way.  Do not use floor polish or wax that makes floors slippery. If you must use wax, use non-skid floor wax.  Do not  have throw rugs and other things on the floor that can make you trip. What can I do with my stairs?  Do not leave any items on the stairs.  Make sure that there are handrails on both sides of the stairs and use them. Fix handrails that are broken or loose. Make sure that handrails are as long as the stairways.  Check any carpeting to make sure that it is firmly attached to the stairs. Fix any carpet that is loose or worn.  Avoid having throw rugs at the top or bottom of the stairs. If you do have throw rugs, attach them to the floor with carpet tape.  Make sure that you have a light switch at the top of the stairs and the bottom of the stairs. If you do not have them, ask someone to add them for you. What else can I do to help prevent falls?  Wear shoes that:  Do not have high heels.  Have rubber bottoms.  Are comfortable and fit you well.  Are  closed at the toe. Do not wear sandals.  If you use a stepladder:  Make sure that it is fully opened. Do not climb a closed stepladder.  Make sure that both sides of the stepladder are locked into place.  Ask someone to hold it for you, if possible.  Clearly mark and make sure that you can see:  Any grab bars or handrails.  First and last steps.  Where the edge of each step is.  Use tools that help you move around (mobility aids) if they are needed. These include:  Canes.  Walkers.  Scooters.  Crutches.  Turn on the lights when you go into a dark area. Replace any light bulbs as soon as they burn out.  Set up your furniture so you have a clear path. Avoid moving your furniture around.  If any of your floors are uneven, fix them.  If there are any pets around you, be aware of where they are.  Review your medicines with your doctor. Some medicines can make you feel dizzy. This can increase your chance of falling. Ask your doctor what other things that you can do to help prevent falls. This information is not intended to replace advice given to you by your health care provider. Make sure you discuss any questions you have with your health care provider. Document Released: 09/27/2009 Document Revised: 05/08/2016 Document Reviewed: 01/05/2015 Elsevier Interactive Patient Education  2017 [employer].

## 2020-10-03 ENCOUNTER — Other Ambulatory Visit: Payer: Self-pay

## 2020-10-04 ENCOUNTER — Encounter: Payer: Self-pay | Admitting: Family Medicine

## 2020-10-04 ENCOUNTER — Ambulatory Visit (INDEPENDENT_AMBULATORY_CARE_PROVIDER_SITE_OTHER): Payer: Medicare Other

## 2020-10-04 ENCOUNTER — Ambulatory Visit (INDEPENDENT_AMBULATORY_CARE_PROVIDER_SITE_OTHER): Payer: Medicare Other | Admitting: Family Medicine

## 2020-10-04 VITALS — BP 128/80 | HR 63 | Temp 98.2°F | Ht 64.0 in | Wt 74.4 lb

## 2020-10-04 DIAGNOSIS — M80052S Age-related osteoporosis with current pathological fracture, left femur, sequela: Secondary | ICD-10-CM | POA: Diagnosis not present

## 2020-10-04 DIAGNOSIS — M810 Age-related osteoporosis without current pathological fracture: Secondary | ICD-10-CM

## 2020-10-04 DIAGNOSIS — I10 Essential (primary) hypertension: Secondary | ICD-10-CM | POA: Diagnosis not present

## 2020-10-04 DIAGNOSIS — M80052A Age-related osteoporosis with current pathological fracture, left femur, initial encounter for fracture: Secondary | ICD-10-CM | POA: Insufficient documentation

## 2020-10-04 MED ORDER — HYDROCODONE-ACETAMINOPHEN 5-325 MG PO TABS: ORAL_TABLET | ORAL | 0 refills | Status: DC

## 2020-10-04 MED ORDER — CALCIUM CARBONATE-VITAMIN D 500-400 MG-UNIT PO TABS: 1.0000 | ORAL_TABLET | Freq: Two times a day (BID) | ORAL | 1 refills | Status: DC

## 2020-10-04 NOTE — Addendum Note (Signed)
Addended by: Varney Biles on: 10/04/2020 10:44 AM   Modules accepted: Orders

## 2020-10-04 NOTE — Progress Notes (Signed)
Established Patient Office Visit  Subjective:  Patient ID: Gabrielle Berry, female    DOB: 1951-11-29  Age: 69 y.o. MRN: [id number]  CC:  Chief Complaint  Patient presents with  . Pain    Pain in rightleg that come and go, becoming worse.     HPI Gabrielle Berry presents for follow-up of her hypertension well-controlled with the low-dose lisinopril. Patient has endured chronic pain of her left hip status post pinning after her fracture 7 years ago. She had taken hydrocodone in the past with some relief. She is a great risk for fall with her history of seizures and frail body habitus. She is using a cane for assisted ambulation.  Past Medical History:  Diagnosis Date  . Anxiety   . Cataract   . Guillain Barr syndrome (HCC)   . Guillain-Barre disease (HCC)   . Seizures (HCC)     Past Surgical History:  Procedure Laterality Date  . EYE SURGERY    . HIP SURGERY Right   . TUBAL LIGATION      Family History  Problem Relation Age of Onset  . Hypertension Mother   . Anxiety disorder Mother   . Diabetes Father     Social History   Socioeconomic History  . Marital status: Widowed    Spouse name: Not on file  . Number of children: Not on file  . Years of education: Not on file  . Highest education level: Not on file  Occupational History  . Not on file  Tobacco Use  . Smoking status: Never Smoker  . Smokeless tobacco: Never Used  Vaping Use  . Vaping Use: Never used  Substance and Sexual Activity  . Alcohol use: Never  . Drug use: Never  . Sexual activity: Not on file  Other Topics Concern  . Not on file  Social History Narrative   Left handed    Lives with daughter      Social Determinants of Health   Financial Resource Strain: Low Risk   . Difficulty of Paying Living Expenses: Not hard at all  Food Insecurity: No Food Insecurity  . Worried About Programme researcher, broadcasting/film/video in the Last Year: Never true  . Ran Out of Food in the Last Year: Never true  Transportation  Needs: No Transportation Needs  . Lack of Transportation (Medical): No  . Lack of Transportation (Non-Medical): No  Physical Activity: Inactive  . Days of Exercise per Week: 0 days  . Minutes of Exercise per Session: 0 min  Stress: No Stress Concern Present  . Feeling of Stress : Not at all  Social Connections: Socially Isolated  . Frequency of Communication with Friends and Family: Once a week  . Frequency of Social Gatherings with Friends and Family: Never  . Attends Religious Services: Never  . Active Member of Clubs or Organizations: No  . Attends Banker Meetings: Never  . Marital Status: Widowed  Intimate Partner Violence: Not At Risk  . Fear of Current or Ex-Partner: No  . Emotionally Abused: No  . Physically Abused: No  . Sexually Abused: No    Outpatient Medications Prior to Visit  Medication Sig Dispense Refill  . donepezil (ARICEPT) 5 MG tablet Take 1 tablet (5 mg total) by mouth at bedtime. 90 tablet 3  . lisinopril (PRINIVIL) 10 MG tablet Take 0.5 tablets (5 mg total) by mouth daily. 90 tablet 1  . PARoxetine (PAXIL) 10 MG tablet Take 1 tablet (10 mg total) by mouth  daily. 90 tablet 1  . phenytoin (DILANTIN) 100 MG ER capsule Take 3 capsules every night 270 capsule 3   No facility-administered medications prior to visit.    Allergies  Allergen Reactions  . Penicillins     ROS Review of Systems  Constitutional: Negative.   HENT: Negative.   Eyes: Negative for photophobia and visual disturbance.  Respiratory: Negative.   Cardiovascular: Negative.   Gastrointestinal: Negative.   Endocrine: Negative for polyphagia and polyuria.  Genitourinary: Negative.   Musculoskeletal: Positive for arthralgias and gait problem.  Neurological: Positive for seizures.  Hematological: Does not bruise/bleed easily.  Psychiatric/Behavioral: Negative.       Objective:    Physical Exam Vitals and nursing note reviewed.  Constitutional:      General: She is  not in acute distress.    Appearance: Normal appearance. She is ill-appearing. She is not toxic-appearing or diaphoretic.  HENT:     Head: Normocephalic and atraumatic.  Eyes:     General: No scleral icterus.       Right eye: No discharge.        Left eye: No discharge.     Conjunctiva/sclera: Conjunctivae normal.  Pulmonary:     Effort: Pulmonary effort is normal.  Musculoskeletal:     Right hip: Decreased range of motion.     Left hip: Tenderness and bony tenderness present. Decreased range of motion.  Skin:    General: Skin is warm and dry.  Neurological:     Mental Status: She is alert and oriented to person, place, and time.  Psychiatric:        Mood and Affect: Mood normal.        Behavior: Behavior normal.     BP 128/80   Pulse 63   Temp 98.2 F (36.8 C) (Tympanic)   Ht 5\' 4"  (1.626 m)   Wt 74 lb 6.4 oz (33.7 kg)   LMP  (LMP Unknown)   SpO2 100%   BMI 12.77 kg/m  Wt Readings from Last 3 Encounters:  10/04/20 74 lb 6.4 oz (33.7 kg)  10/02/20 75 lb 12.8 oz (34.4 kg)  08/27/20 74 lb 9.6 oz (33.8 kg)     Health Maintenance Due  Topic Date Due  . Hepatitis C Screening  Never done  . TETANUS/TDAP  Never done  . MAMMOGRAM  Never done  . COLONOSCOPY  Never done  . DEXA SCAN  Never done  . PNA vac Low Risk Adult (1 of 2 - PCV13) Never done    There are no preventive care reminders to display for this patient.  Lab Results  Component Value Date   TSH 1.38 06/27/2020   Lab Results  Component Value Date   WBC 6.9 06/27/2020   HGB 13.6 06/27/2020   HCT 41.1 06/27/2020   MCV 99.5 06/27/2020   PLT 198.0 06/27/2020   Lab Results  Component Value Date   NA 138 06/27/2020   K 4.2 06/27/2020   CO2 22 06/27/2020   GLUCOSE 99 06/27/2020   BUN 14 06/27/2020   CREATININE 0.82 06/27/2020   BILITOT 0.4 06/27/2020   ALKPHOS 85 06/27/2020   AST 19 06/27/2020   ALT 14 06/27/2020   PROT 7.3 06/27/2020   ALBUMIN 4.4 06/27/2020   CALCIUM 9.6 06/27/2020   GFR  68.67 06/27/2020   No results found for: CHOL No results found for: HDL No results found for: LDLCALC No results found for: TRIG No results found for: CHOLHDL No results found for:  HGBA1C    Assessment & Plan:   Problem List Items Addressed This Visit      Cardiovascular and Mediastinum   Essential hypertension   Relevant Orders   Basic metabolic panel     Musculoskeletal and Integument   Senile osteoporosis   Relevant Medications   calcium-vitamin D (OSCAL-500) 500-400 MG-UNIT tablet   Other Relevant Orders   DG Bone Density   Osteoporotic fracture of left hip (HCC) - Primary   Relevant Medications   calcium-vitamin D (OSCAL-500) 500-400 MG-UNIT tablet   HYDROcodone-acetaminophen (NORCO) 5-325 MG tablet   Other Relevant Orders   DG Hip Unilat W OR W/O Pelvis 2-3 Views Left      Meds ordered this encounter  Medications  . calcium-vitamin D (OSCAL-500) 500-400 MG-UNIT tablet    Sig: Take 1 tablet by mouth 2 (two) times daily.    Dispense:  180 tablet    Refill:  1  . HYDROcodone-acetaminophen (NORCO) 5-325 MG tablet    Sig: May take one nightly as needed.    Dispense:  90 tablet    Refill:  0    Follow-up: Return in about 3 months (around 01/04/2021).  Patient will use hydrocodone 5 mg at night as needed. She will use Voltaren gel or Tylenol throughout the day. She will continue assist in ambulation with her cane. We will send for bone density amatory testing. Continue low-dose lisinopril for blood pressure control.  Patient is frail and she is a fall risk.  I suggested that she use a walker.  I think it is appropriate to restrict her hydrocodone to once at night.  [name], MD

## 2020-12-10 ENCOUNTER — Other Ambulatory Visit: Payer: Self-pay | Admitting: Family Medicine

## 2020-12-10 DIAGNOSIS — M80052S Age-related osteoporosis with current pathological fracture, left femur, sequela: Secondary | ICD-10-CM

## 2020-12-10 DIAGNOSIS — F419 Anxiety disorder, unspecified: Secondary | ICD-10-CM

## 2020-12-10 NOTE — Telephone Encounter (Signed)
Please see message and advise.  Thank you. Last OV 10/04/20 Last fill for Hydrocodone 10/04/20  #90/0 Last fill for Paxil  05/18/20  #90/1

## 2021-01-04 ENCOUNTER — Ambulatory Visit: Payer: Medicare Other | Admitting: Family Medicine

## 2021-01-14 ENCOUNTER — Other Ambulatory Visit: Payer: Self-pay

## 2021-01-14 ENCOUNTER — Emergency Department (HOSPITAL_BASED_OUTPATIENT_CLINIC_OR_DEPARTMENT_OTHER): Payer: Medicare Other

## 2021-01-14 ENCOUNTER — Encounter (HOSPITAL_BASED_OUTPATIENT_CLINIC_OR_DEPARTMENT_OTHER): Payer: Self-pay

## 2021-01-14 ENCOUNTER — Emergency Department (HOSPITAL_BASED_OUTPATIENT_CLINIC_OR_DEPARTMENT_OTHER)
Admission: EM | Admit: 2021-01-14 | Discharge: 2021-01-14 | Disposition: A | Discharge status: Home / Self Care | Payer: Medicare Other | Attending: Emergency Medicine | Admitting: Emergency Medicine

## 2021-01-14 DIAGNOSIS — R4182 Altered mental status, unspecified: Secondary | ICD-10-CM | POA: Diagnosis present

## 2021-01-14 DIAGNOSIS — W19XXXA Unspecified fall, initial encounter: Secondary | ICD-10-CM | POA: Insufficient documentation

## 2021-01-14 DIAGNOSIS — U071 COVID-19: Secondary | ICD-10-CM | POA: Insufficient documentation

## 2021-01-14 DIAGNOSIS — F0390 Unspecified dementia without behavioral disturbance: Secondary | ICD-10-CM | POA: Insufficient documentation

## 2021-01-14 LAB — CBC WITH DIFFERENTIAL/PLATELET
Abs Immature Granulocytes: 0.01 10*3/uL (ref 0.00–0.07)
Basophils Absolute: 0 10*3/uL (ref 0.0–0.1)
Basophils Relative: 1 %
Eosinophils Absolute: 0 10*3/uL (ref 0.0–0.5)
Eosinophils Relative: 0 %
HCT: 43 % (ref 36.0–46.0)
Hemoglobin: 14.2 g/dL (ref 12.0–15.0)
Immature Granulocytes: 0 %
Lymphocytes Relative: 54 %
Lymphs Abs: 2 10*3/uL (ref 0.7–4.0)
MCH: 31.4 pg (ref 26.0–34.0)
MCHC: 33 g/dL (ref 30.0–36.0)
MCV: 95.1 fL (ref 80.0–100.0)
Monocytes Absolute: 0.4 10*3/uL (ref 0.1–1.0)
Monocytes Relative: 10 %
Neutro Abs: 1.3 10*3/uL — ABNORMAL LOW (ref 1.7–7.7)
Neutrophils Relative %: 35 %
Platelets: 142 10*3/uL — ABNORMAL LOW (ref 150–400)
RBC: 4.52 MIL/uL (ref 3.87–5.11)
RDW: 13.5 % (ref 11.5–15.5)
WBC: 3.6 10*3/uL — ABNORMAL LOW (ref 4.0–10.5)
nRBC: 0 % (ref 0.0–0.2)

## 2021-01-14 LAB — URINALYSIS, ROUTINE W REFLEX MICROSCOPIC
Bilirubin Urine: NEGATIVE
Glucose, UA: NEGATIVE mg/dL
Hgb urine dipstick: NEGATIVE
Ketones, ur: 40 mg/dL — AB
Leukocytes,Ua: NEGATIVE
Nitrite: NEGATIVE
Protein, ur: NEGATIVE mg/dL
Specific Gravity, Urine: 1.03 (ref 1.005–1.030)
pH: 5 (ref 5.0–8.0)

## 2021-01-14 LAB — BASIC METABOLIC PANEL
Anion gap: 8 (ref 5–15)
BUN: 14 mg/dL (ref 8–23)
CO2: 26 mmol/L (ref 22–32)
Calcium: 8.5 mg/dL — ABNORMAL LOW (ref 8.9–10.3)
Chloride: 100 mmol/L (ref 98–111)
Creatinine, Ser: 0.71 mg/dL (ref 0.44–1.00)
GFR, Estimated: >60 mL/min (ref >60)
Glucose, Bld: 98 mg/dL (ref 70–99)
Potassium: 5.1 mmol/L (ref 3.5–5.1)
Sodium: 134 mmol/L — ABNORMAL LOW (ref 135–145)

## 2021-01-14 LAB — HEPATIC FUNCTION PANEL
ALT: 25 U/L (ref 0–44)
AST: 43 U/L — ABNORMAL HIGH (ref 15–41)
Albumin: 4 g/dL (ref 3.5–5.0)
Alkaline Phosphatase: 110 U/L (ref 38–126)
Bilirubin, Direct: 0.1 mg/dL (ref 0.0–0.2)
Indirect Bilirubin: 0 mg/dL — ABNORMAL LOW (ref 0.3–0.9)
Total Bilirubin: 0.1 mg/dL — ABNORMAL LOW (ref 0.3–1.2)
Total Protein: 8.2 g/dL — ABNORMAL HIGH (ref 6.5–8.1)

## 2021-01-14 LAB — SARS CORONAVIRUS 2 BY RT PCR (HOSPITAL ORDER, PERFORMED IN ~~LOC~~ HOSPITAL LAB): SARS Coronavirus 2: POSITIVE — AB

## 2021-01-14 LAB — CK: Total CK: 55 U/L (ref 38–234)

## 2021-01-14 LAB — TROPONIN I (HIGH SENSITIVITY)
Troponin I (High Sensitivity): 6 ng/L (ref <18)
Troponin I (High Sensitivity): 7 ng/L (ref <18)

## 2021-01-14 MED ORDER — SODIUM CHLORIDE 0.9 % IV BOLUS
500.0000 mL | Freq: Once | INTRAVENOUS | Status: AC
  Administered 2021-01-14: 500 mL via INTRAVENOUS

## 2021-01-14 NOTE — ED Notes (Signed)
Family and Patient verbalizes understanding of discharge instructions. Opportunity for questioning and answers were provided. Armband removed by staff, pt discharged from ED in wheelchair with family to home.

## 2021-01-14 NOTE — ED Notes (Signed)
Safety measures in place, daughter at side, call bell within reach, sr x 2 up, bed in lowest position, frequent rounding being done

## 2021-01-14 NOTE — Discharge Instructions (Signed)
Thank you for allowing [place] to care for you today.   Please return to the emergency department if you have any new or worsening symptoms.  You tested positive for covid-19 today. Today counts as day 0.   Medications- You can take medications to help treat your symptoms: -Tylenol for fever and body aches. Please take as prescribed on the bottle. -Over the coutner cough medicine such as mucinex, robitussin, or other brands. -Flonase or saline nasal spray for nasal congestion -Vitamins as recommended by CDC: vitamin C, D and Zinc  Treatment- This is a virus and unfortunately there are no antibitotics approved to treat this virus at this time. It is important to monitor your symptoms closely: -You should have a theremometer at home to check your temperature when feeling feverish. -Use a pulse ox meter to measure your oxygen when feeling short of breath.  -If your fever is over 100.4 despite taking tylenol or if your oxygen level drops below 92% these are reasons to return to the emergency department for further evaluation.   -CDC quarantine guidelines are asking you to isolate for 5 days then -You can return to work, school or normal activities if on day 6 you are fever free without the use of Tylenol or ibuprofen. You need to wear a mask for the next 5 days. -If you are still having fever or sever symptoms on day 5 you need to continue isolation until day 10.   -If family members are wanting to get tested there are multiple sites in the commnuity to get an outpatient test. Go online and search for "covid testing near me." If family members are having symptoms they should follow the same quarantine guidelines.  Again: symptoms of shortness of breath, chest pain, difficulty breathing, new onset of confusion, any symptoms that are concerning. If any of these symptoms you should come to emergency department for evaluation.   I hope you feel better soon

## 2021-01-14 NOTE — ED Notes (Signed)
Daughter states has had 2 falls in 2 days. Pt does not recall falling. Family does not think she hit her head.

## 2021-01-14 NOTE — ED Triage Notes (Signed)
Per daughter pt was found on the floor in the living room ~5pm yesterday-pt found in her bedroom floor ~10am today-she states pt told her yesterday she became dizzy and fell-today she stated she did not know what happened-reports pt has hx of dementia-pt alert to name and DOB only-pt denies pain -states "I don't feel good" to triage in own w/c

## 2021-01-14 NOTE — ED Provider Notes (Signed)
[facility] EMERGENCY DEPARTMENT Provider Note   CSN: [id number] Arrival date & time: 01/14/21  1142     History Chief Complaint  Patient presents with  . Fall  . Tremors  . Altered Mental Status    Gabrielle Berry is a 70 y.o. female with past medical history significant for anxiety, Guillain-Barr, seizures. Not anticoagulated. Daughter at the bedside and provides history. Did not have covid vaccinations.  HPI Patient presents to emergency room today with chief complaint of fall, tremors, altered mental status x1 day.  Granddaughter found patient sitting on the floor around 5 PM yesterday.  When daughter got to the patient she states she felt weak and had to sit down.  She was able to get patient up with maximum assistance and noticed that she seemed confused.  She states over the last week patient has had difficulty performing ADLs.  She is typically ambulatory at baseline without assistance.  Daughter went in patient's room at 10 AM this morning and again she was sitting on the floor.  She was unable to explain why she was sitting in kept repeating that she felt weak.  Daughter states patient has had rapid decline in the last 24 hours and states last known normal was x1 week ago. Daughter administers medications at night and noted patient is compliant with them.  She has noticed that her tremor seems to be worse than usual.  When talking to the patient she is unable to answer questions as she is typically able to.  Daughter denies any witnessed fall patient hitting her head or loss of consciousness.     Patient unable to provide any history therefore level 5 caveat applies secondary to altered mental status.      Past Medical History:  Diagnosis Date  . Anxiety   . Cataract   . Guillain Barr syndrome (HCC)   . Guillain-Barre disease (HCC)   . Seizures [facility](HCC)     Patient Active Problem List   Diagnosis Date Noted  . Senile osteoporosis 10/04/2020  . Osteoporotic  fracture of left hip (HCC) 10/04/2020  . Seizure disorder (HCC) 05/18/2020  . Alzheimer's dementia without behavioral disturbance (HCC) 05/18/2020  . Anxiety 05/18/2020  . Essential hypertension 05/18/2020  . History of Guillain-Barre syndrome 05/18/2020  . Guillain-Barre syndrome (HCC) 04/17/2020    Past Surgical History:  Procedure Laterality Date  . EYE SURGERY    . HIP SURGERY Right   . TUBAL LIGATION       OB History   No obstetric history on file.     Family History  Problem Relation Age of Onset  . Hypertension Mother   . Anxiety disorder Mother   . Diabetes Father     Social History   Tobacco Use  . Smoking status: Never Smoker  . Smokeless tobacco: Never Used  Vaping Use  . Vaping Use: Never used  Substance Use Topics  . Alcohol use: Never  . Drug use: Never    Home Medications Prior to Admission medications   Medication Sig Start Date End Date Taking? Authorizing Provider  calcium-vitamin D (OSCAL-500) 500-400 MG-UNIT tablet Take 1 tablet by mouth 2 (two) times daily. 10/04/20  Yes Mliss SaxKremer, William Alfred, MD  donepezil (ARICEPT) 5 MG tablet Take 1 tablet (5 mg total) by mouth at bedtime. 08/27/20  Yes Van ClinesAquino, Karen M, MD  HYDROcodone-acetaminophen (NORCO/VICODIN) 5-325 MG tablet TAKE 1 TABLET BY MOUTH NIGHTLY AS NEEDED 12/10/20  Yes Worthy RancherWebb, Padonda B, FNP  lisinopril (PRINIVIL) 10  MG tablet Take 0.5 tablets (5 mg total) by mouth daily. 05/18/20  Yes [name], MD  PARoxetine (PAXIL) 10 MG tablet TAKE 1 TABLET (10 MG TOTAL) BY MOUTH DAILY. 12/10/20  Yes Worthy Rancher B, FNP  phenytoin (DILANTIN) 100 MG ER capsule Take 3 capsules every night 08/27/20  Yes Van Clines, MD    Allergies    Penicillins  Review of Systems   Review of Systems  Unable to perform ROS: Mental status change    Physical Exam Updated Vital Signs BP 113/65 (BP Location: Right Arm)   Pulse 90   Temp 99 F (37.2 C) (Oral)   Resp 18   Ht 5\' 3"  (1.6 m)   Wt 35.4 kg    LMP  (LMP Unknown)   SpO2 98%   BMI 13.82 kg/m   Physical Exam Vitals and nursing note reviewed.  Constitutional:      General: She is not in acute distress.    Appearance: She is not ill-appearing.     Comments: Frail-appearing elderly female  HENT:     Head: Normocephalic and atraumatic.     Comments: No tenderness to palpation of skull. No deformities or crepitus noted. No open wounds, abrasions or lacerations.    Right Ear: Tympanic membrane and external ear normal.     Left Ear: Tympanic membrane and external ear normal.     Nose: Nose normal.     Mouth/Throat:     Mouth: Mucous membranes are moist.     Pharynx: Oropharynx is clear.  Eyes:     General: No scleral icterus.       Right eye: No discharge.        Left eye: No discharge.     Extraocular Movements: Extraocular movements intact.     Conjunctiva/sclera: Conjunctivae normal.     Pupils: Pupils are equal, round, and reactive to light.  Neck:     Vascular: No JVD.     Comments: Full ROM intact without spinous process TTP. No bony stepoffs or deformities, no paraspinous muscle TTP or muscle spasms. No rigidity or meningeal signs. No bruising, erythema, or swelling.  Cardiovascular:     Rate and Rhythm: Normal rate and regular rhythm.     Pulses: Normal pulses.          Radial pulses are 2+ on the right side and 2+ on the left side.     Heart sounds: Normal heart sounds.  Pulmonary:     Comments: Lungs clear to auscultation in all fields. Symmetric chest rise. No wheezing, rales, or rhonchi. Abdominal:     Comments: Abdomen is soft, non-distended, and non-tender in all quadrants. No rigidity, no guarding. No peritoneal signs.  Musculoskeletal:     Cervical back: Normal range of motion.     Comments: Pelvis is stable.  No leg length discrepancy.  No obvious deformity of extremities.  No cervical, thoracic, or lumbar spinal tenderness to palpation. No paraspinal tenderness. No step offs, crepitus or deformity  palpated.   Skin:    General: Skin is warm and dry.     Capillary Refill: Capillary refill takes less than 2 seconds.  Neurological:     GCS: GCS eye subscore is 4. GCS verbal subscore is 5. GCS motor subscore is 6.     Comments: Speech is intermittently garbled.  No facial droop. She is alert to self and location only which is baseline per daughter. Difficulty following commands, has to be prompted multiple times. Strong  and equal grip strength in all extremities.  Attempted to ambulate patient with daughter's assistance. Patient only able to stand for seconds with maximum assistance. Unable to say where she is in pain.  Psychiatric:        Behavior: Behavior normal.     ED Results / Procedures / Treatments   Labs (all labs ordered are listed, but only abnormal results are displayed) Labs Reviewed  SARS CORONAVIRUS 2 BY RT PCR (HOSPITAL ORDER, PERFORMED IN  HOSPITAL LAB) - Abnormal; Notable for the following components:      Result Value   SARS Coronavirus 2 POSITIVE (*)    All other components within normal limits  CBC WITH DIFFERENTIAL/PLATELET - Abnormal; Notable for the following components:   WBC 3.6 (*)    Platelets 142 (*)    Neutro Abs 1.3 (*)    All other components within normal limits  BASIC METABOLIC PANEL - Abnormal; Notable for the following components:   Sodium 134 (*)    Calcium 8.5 (*)    All other components within normal limits  URINALYSIS, ROUTINE W REFLEX MICROSCOPIC - Abnormal; Notable for the following components:   Ketones, ur 40 (*)    All other components within normal limits  HEPATIC FUNCTION PANEL - Abnormal; Notable for the following components:   Total Protein 8.2 (*)    AST 43 (*)    Total Bilirubin 0.1 (*)    Indirect Bilirubin 0.0 (*)    All other components within normal limits  URINE CULTURE  CK  TROPONIN I (HIGH SENSITIVITY)  TROPONIN I (HIGH SENSITIVITY)    EKG EKG Interpretation  Date/Time:  Monday January 14 2021  15:41:08 EST Ventricular Rate:  85 PR Interval:    QRS Duration: 84 QT Interval:  359 QTC Calculation: 427 R Axis:   0 Text Interpretation: Sinus rhythm Biatrial enlargement Indeterminate axis RSR' in V1 or V2, right VCD or RVH Nonspecific T abnormalities, lateral leads RBBB, no STEMI Confirmed by Coralee Pesa [phone]) on 01/14/2021 3:53:53 PM   Radiology CT Head Wo Contrast  Result Date: 01/14/2021 CLINICAL DATA:  Multiple falls.  Altered mental status. EXAM: CT HEAD WITHOUT CONTRAST CT CERVICAL SPINE WITHOUT CONTRAST TECHNIQUE: Multidetector CT imaging of the head and cervical spine was performed following the standard protocol without intravenous contrast. Multiplanar CT image reconstructions of the cervical spine were also generated. COMPARISON:  None. FINDINGS: CT HEAD FINDINGS Brain: Large calcification is seen in the central pons which most likely represents chronic sequela of prior infarction or infection. No mass effect or midline shift is noted. Ventricular size is within normal limits. There is no evidence of mass lesion, hemorrhage or acute infarction. Vascular: No hyperdense vessel or unexpected calcification. Skull: Normal. Negative for fracture or focal lesion. Sinuses/Orbits: No acute finding. Other: None. CT CERVICAL SPINE FINDINGS Alignment: Mild grade 1 anterolisthesis of C4-5 is noted secondary to posterior facet joint hypertrophy. Skull base and vertebrae: No acute fracture. No primary bone lesion or focal pathologic process. Soft tissues and spinal canal: No prevertebral fluid or swelling. No visible canal hematoma. Disc levels: Moderate degenerative disc disease is noted at C5-6, C6-7 and C7-T1. Upper chest: Negative. Other: Degenerative changes are seen involving posterior facet joints bilaterally. IMPRESSION: 1. No acute intracranial abnormality seen. 2. Multilevel degenerative disc disease. No acute abnormality seen in the cervical spine. Electronically Signed   By: Lupita Raider M.D.   On: 01/14/2021 16:24   CT Cervical Spine Wo Contrast  Result  Date: 01/14/2021 CLINICAL DATA:  Multiple falls.  Altered mental status. EXAM: CT HEAD WITHOUT CONTRAST CT CERVICAL SPINE WITHOUT CONTRAST TECHNIQUE: Multidetector CT imaging of the head and cervical spine was performed following the standard protocol without intravenous contrast. Multiplanar CT image reconstructions of the cervical spine were also generated. COMPARISON:  None. FINDINGS: CT HEAD FINDINGS Brain: Large calcification is seen in the central pons which most likely represents chronic sequela of prior infarction or infection. No mass effect or midline shift is noted. Ventricular size is within normal limits. There is no evidence of mass lesion, hemorrhage or acute infarction. Vascular: No hyperdense vessel or unexpected calcification. Skull: Normal. Negative for fracture or focal lesion. Sinuses/Orbits: No acute finding. Other: None. CT CERVICAL SPINE FINDINGS Alignment: Mild grade 1 anterolisthesis of C4-5 is noted secondary to posterior facet joint hypertrophy. Skull base and vertebrae: No acute fracture. No primary bone lesion or focal pathologic process. Soft tissues and spinal canal: No prevertebral fluid or swelling. No visible canal hematoma. Disc levels: Moderate degenerative disc disease is noted at C5-6, C6-7 and C7-T1. Upper chest: Negative. Other: Degenerative changes are seen involving posterior facet joints bilaterally. IMPRESSION: 1. No acute intracranial abnormality seen. 2. Multilevel degenerative disc disease. No acute abnormality seen in the cervical spine. Electronically Signed   By: Lupita Raider M.D.   On: 01/14/2021 16:24   DG Pelvis Portable  Result Date: 01/14/2021 CLINICAL DATA:  Status post fall 2 days ago. EXAM: PORTABLE PELVIS 1-2 VIEWS COMPARISON:  None. FINDINGS: Generalized osteopenia. Old right femoral neck fracture transfixed with a metallic sideplate and dynamic compression screw.  Moderate osteoarthritis of the right hip. Severe osteoarthritis of the left hip. No acute fracture or dislocation. IMPRESSION: No acute osseous injury of the pelvis. Electronically Signed   By: Elige Ko   On: 01/14/2021 15:35   DG Chest Portable 1 View  Result Date: 01/14/2021 CLINICAL DATA:  Fall, altered mental status EXAM: PORTABLE CHEST 1 VIEW COMPARISON:  None. FINDINGS: The lungs are symmetrically mildly hyperinflated suggesting changes of underlying COPD. The lungs are clear. No pneumothorax or pleural effusion. Cardiac size within normal limits. Pulmonary vascularity is normal. No acute bone abnormality. IMPRESSION: No active disease.  COPD. Electronically Signed   By: Helyn Numbers MD   On: 01/14/2021 15:27    Procedures Procedures   Medications Ordered in ED Medications  sodium chloride 0.9 % bolus 500 mL (0 mLs Intravenous Stopped 01/14/21 1828)  sodium chloride 0.9 % bolus 500 mL (0 mLs Intravenous Stopped 01/14/21 1954)    ED Course  I have reviewed the triage vital signs and the nursing notes.  Pertinent labs & imaging results that were available during my care of the patient were reviewed by me and considered in my medical decision making (see chart for details).    MDM Rules/Calculators/A&P                          History provided by patient with additional history obtained from chart review.    70 year old female presenting with fall and change in mental status.  On ED arrival she is afebrile, hemodynamically stable.  My exam she is a frail-appearing female, no acute distress.  She is intermittently garbled speech.  She has known tremor in upper extremities per daughter it is worse. It is constant on my exam, even at rest. Pelvis is stable and no obvious deformity of extremities. No tenderness to palpation of head, neck,  and back. Patient is outside of tpa window, symptoms >24 hours. Patient has no cardiac history, given her age will provide gentle hydration with 500  ml NS.  Work up initiated in triage with CBC and BMP. CBC with leukopenia 3.6 and thrombocytopenia 142 without history of either. Hemoglobin normal. BMP overall unremarkable.  CK within normal range. Troponin 7, doubt ACS. LFTs overall unremarkable. EKG without ischemic changes. Covid test is positive. I viewed all images. CT head and CT cervical spine without acute traumatic findings. Xray of chest and xray of pelvis also without any fractures or dislocation, no signs of pneumonia.  Agree with radiologist impression.  Reassessed patient and mentation has significantly improved.  She is able to hold conversation with me and speech is normal.  She was able to get up and use the bedside commode with assistance. UA without signs of infection.  She does have 40 ketones suggestive of dehydration. Patient given additional 500 mL normal saline.  Engaged in shared decision-making with patient's daughter.  She feels that she can care for patient at home and would rather not have her admitted to the hospital.  I feel this is reasonable as patient does not have an oxygen requirement at this time.  Discussed the importance of hydration at home.    The patient appears reasonably screened and/or stabilized for discharge and I doubt any other medical condition or other [facility] requiring further screening, evaluation, or treatment in the ED at this time prior to discharge. The patient is safe for discharge with strict return precautions discussed. Findings and plan of care discussed with supervising physician Dr. Wilkie Aye.  Gabrielle Berry was evaluated in Emergency Department on 01/14/2021 for the symptoms described in the history of present illness. She was evaluated in the context of the global COVID-19 pandemic, which necessitated consideration that the patient might be at risk for infection with the SARS-CoV-2 virus that causes COVID-19. Institutional protocols and algorithms that pertain to the evaluation of patients at  risk for COVID-19 are in a state of rapid change based on information released by regulatory bodies including the CDC and federal and state organizations. These policies and algorithms were followed during the patient's care in the ED.   Portions of this note were generated with Scientist, clinical (histocompatibility and immunogenetics). Dictation errors may occur despite best attempts at proofreading.    Final Clinical Impression(s) / ED Diagnoses Final diagnoses:  COVID    Rx / DC Orders ED Discharge Orders    None       Shanon Ace, PA-C 01/14/21 2031    Rozelle Logan, DO 01/15/21 2306

## 2021-01-15 ENCOUNTER — Telehealth: Payer: Self-pay | Admitting: Adult Health

## 2021-01-15 NOTE — Telephone Encounter (Signed)
Called to discuss with patient about COVID-19 symptoms and the use of one of the available treatments for those with mild to moderate Covid symptoms and at a high risk of hospitalization.  Pt appears to qualify for outpatient treatment due to co-morbid conditions and/or a member of an at-risk group in accordance with the FDA Emergency Use Authorization.    Symptom onset: ?  Vaccinated: No  Booster? No  Immunocompromised? No  Qualifiers: Yes   Unable to reach pt - Left message on voicemail to call hotline.   Rubye Oaks NP -C

## 2021-01-16 LAB — URINE CULTURE

## 2021-02-14 ENCOUNTER — Ambulatory Visit: Payer: Medicare Other | Admitting: Family Medicine

## 2021-03-27 ENCOUNTER — Other Ambulatory Visit: Payer: Self-pay

## 2021-03-27 ENCOUNTER — Telehealth (INDEPENDENT_AMBULATORY_CARE_PROVIDER_SITE_OTHER): Payer: Medicare Other | Admitting: Neurology

## 2021-03-27 ENCOUNTER — Encounter: Payer: Self-pay | Admitting: Neurology

## 2021-03-27 VITALS — Ht 62.0 in | Wt 85.0 lb

## 2021-03-27 DIAGNOSIS — G301 Alzheimer's disease with late onset: Secondary | ICD-10-CM

## 2021-03-27 DIAGNOSIS — F0280 Dementia in other diseases classified elsewhere without behavioral disturbance: Secondary | ICD-10-CM | POA: Diagnosis not present

## 2021-03-27 DIAGNOSIS — G40309 Generalized idiopathic epilepsy and epileptic syndromes, not intractable, without status epilepticus: Secondary | ICD-10-CM

## 2021-03-27 MED ORDER — DONEPEZIL HCL 5 MG PO TABS: 5.0000 mg | ORAL_TABLET | Freq: Every day | ORAL | 3 refills | Status: DC

## 2021-03-27 MED ORDER — PHENYTOIN SODIUM EXTENDED 100 MG PO CAPS: ORAL_CAPSULE | ORAL | 3 refills | Status: DC

## 2021-03-27 NOTE — Addendum Note (Signed)
Addended by: Dimas Chyle on: 03/27/2021 11:39 AM   Modules accepted: Orders

## 2021-03-27 NOTE — Progress Notes (Signed)
Virtual Visit via Video Note The purpose of this virtual visit is to provide medical care while limiting exposure to the novel coronavirus.    Consent was obtained for video visit:  Yes.   Answered questions that patient had about telehealth interaction:  Yes.   I discussed the limitations, risks, security and privacy concerns of performing an evaluation and management service by telemedicine. I also discussed with the patient that there may be a patient responsible charge related to this service. The patient expressed understanding and agreed to proceed.  Pt location: Home Physician Location: office Name of referring provider:  [name],* I connected with Avon Gully at patients initiation/request on 03/27/2021 at 10:00 AM EDT by video enabled telemedicine application and verified that I am speaking with the correct person using two identifiers. Pt MRN:  [id number] Pt DOB:  10-15-1951 Video Participants:  Avon Gully;  [name] (daughter)   History of Present Illness:  The patient had a virtual video visit on 03/27/2021. She was last seen in the neurology clinic 7 months ago for seizures and dementia. She is again accompanied by her daughter [name] who helps supplement the history today. Records and images were personally reviewed where available. She reports feeling fine, [name] states she has been doing pretty well, eating well. She has been seizure-free since May 2021 on Dilantin 300mg  qhs. No side effects. They deny any staring/unresponsive episodes, olfactory/gustatory hallucinations, focal numbness/tingling/weakness, myoclonic jerks. No headaches, dizziness, diplopia, no falls. Sleep and mood are good, no behavioral changes. She ambulates with either her cane or walker. She is on Donepezil 5mg  daily. , medications, meals. She does not drive. Records from her prior neurologist were reviewed. 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 NCV  of both legs in 2019 showed severe neuropathy with no potentials in any of the nerves tested, indicative of a significant neurogenic weakness with scattered areas of chronic denervation in the legs. She had COVID in January 2022 with no residual symptoms.    History on Initial Assessment 08/27/2020: This is a pleasant 70 year old left-handed woman with a history of hypertension, dementia, epilepsy, recurrent Guillain-Barre syndrome, presenting to establish care. She moved to [place] to live with her daughter last May, after hospital admission for fever and seizure. She had been seizure-free since 1994 on Dilantin 300mg  qhs, until she had a seizure last May 2021 and was found to have non-existent Dilantin level. They found out she had not refilled it since January 2021. She had also stopped the Donepezil that time. She had been living alone in , with her nephew staying with her but not helping. She had a boyfriend she would visit daily, and denied getting lost driving to his home. He passed away recently. She was not missing bill payments, her daughter states she was not missing it because she knew she had to pay them, but when it came to taking care of herself, this was a different issue. She was forgetting to eat and 'was living off peanut butter and jelly sandwiches. She feels her memory is okay, but has word-finding difficulties. She was diagnosed with dementia around 2 years ago and has been taking Donepezil 5mg  daily without side effects. She has a history of seizures since childhood with generalized tonic-clonic seizures that were well-controlled on Dilantin until last May. Her daughter denies any staring/unresponsive episodes. She denies any olfactory/gustatory hallucinations, myoclonic jerks. She has a history of 2 episodes of Guillain-Barre, the first occurred  after a flu shot in 1997, she was intubated in [facility] and admitted for a month. She had another episode of GBS in 2000 when she started  having constant itching in her feet, the diagnosis was confirmed and she received IV treatment and more physical therapy because she could not walk/use her hands or legs. She fractured her right leg at that time trying to ambulate. She has had chronic right leg pain since then. She cannot feel her feet and has paresthesias in her fingers. She denies any headaches. She gets dizzy walking sometimes. Her last fall was in April. She denies any diplopia, dysarthria/dysphagia, neck/back pain, bowel/bladder dysfunction, anosmia. She has tremors in both hands. Sleep is good. Since moving in with [name], her daughter manages meals, finances, medications. She does not drive. She is independent with dressing and bathing. No personality changes. She had delirium in May and was hallucinating initially upon return to [facility], this has resolved. No family history of dementia. She does not drink alcohol.   Epilepsy Risk Factors:  There is a family history of seizures in her daughter (childhood seizures), granddaughter, and niece. She had febrile seizures in childhood. Otherwise she had a normal birth and early development.  There is no history of CNS infections such as meningitis/encephalitis, significant traumatic brain injury, neurosurgical procedures.  Prior AEDs: Keppra     Current Outpatient Medications on File Prior to Visit  Medication Sig Dispense Refill  . calcium-vitamin D (OSCAL-500) 500-400 MG-UNIT tablet Take 1 tablet by mouth 2 (two) times daily. 180 tablet 1  . donepezil (ARICEPT) 5 MG tablet Take 1 tablet (5 mg total) by mouth at bedtime. 90 tablet 3  . HYDROcodone-acetaminophen (NORCO/VICODIN) 5-325 MG tablet TAKE 1 TABLET BY MOUTH NIGHTLY AS NEEDED 90 tablet 0  . lisinopril (PRINIVIL) 10 MG tablet Take 0.5 tablets (5 mg total) by mouth daily. 90 tablet 1  . PARoxetine (PAXIL) 10 MG tablet TAKE 1 TABLET (10 MG TOTAL) BY MOUTH DAILY. 90 tablet 1  . phenytoin (DILANTIN) 100 MG ER capsule Take 3 capsules  every night 270 capsule 3   No current facility-administered medications on file prior to visit.     Observations/Objective:   Vitals:   03/27/21 0927  Weight: 85 lb (38.6 kg)  Height: 5\' 2"  (1.575 m)   GEN:  The patient appears stated age and is in NAD, frail-appearing  Neurological examination: Patient is awake, alert. No aphasia or dysarthria. Intact fluency and comprehension. Cranial nerves: Extraocular movements intact with no nystagmus. No facial asymmetry. Motor: moves all extremities symmetrically, at least anti-gravity x 4. No incoordination on finger to nose testing. There is a mild postural tremor seen. Gait: usually ambulates with either cane or walker   Assessment and Plan:   This is a pleasant 70 yo LH woman with a history of hypertension, dementia, epilepsy, CIDP, who had been seizure-free from 1994 until May 2021 due to medication non-compliance. She is back on Dilantin 300mg  qhs with no seizure recurrence. Dilantin level in 06/2020 was subtherapeutic, would re-check level, however she has been on same dose for many years seizure-free, would stay on same dose, refills sent. Continue Donepezil 5mg  daily for dementia, her daughter reports she is overall stable. Continue 24/7 care. She does not drive. Follow-up in 1 year, call for any changes.    Follow Up Instructions:   -I discussed the assessment and treatment plan with the patient/daughter. The patient/daughter were provided an opportunity to ask questions and all were  answered. The patient/daughter agreed with the plan and demonstrated an understanding of the instructions.   The patient/daughter were advised to call back or seek an in-person evaluation if the symptoms worsen or if the condition fails to improve as anticipated.     Van Clines, MD

## 2021-03-27 NOTE — Patient Instructions (Signed)
Good to see you! Continue Dilantin 300mg  every night and Donepezil 5mg  daily. Check Dilantin level. Follow-up in 1 year, call for any changes.    Seizure Precautions: 1. If medication has been prescribed for you to prevent seizures, take it exactly as directed.  Do not stop taking the medicine without talking to your doctor first, even if you have not had a seizure in a long time.   2. Avoid activities in which a seizure would cause danger to yourself or to others.  Don't operate dangerous machinery, swim alone, or climb in high or dangerous places, such as on ladders, roofs, or girders.  Do not drive unless your doctor says you may.  3. If you have any warning that you may have a seizure, lay down in a safe place where you can't hurt yourself.    4.  No driving for 6 months from last seizure, as per [facility].   Please refer to the following link on the Epilepsy Foundation of America's website for more information: http://www.epilepsyfoundation.org/answerplace/Social/driving/drivingu.cfm   5.  Maintain good sleep hygiene.  6.  Contact your doctor if you have any problems that may be related to the medicine you are taking.  7.  Call 911 and bring the patient back to the ED if:        A.  The seizure lasts longer than 5 minutes.       B.  The patient doesn't awaken shortly after the seizure  C.  The patient has new problems such as difficulty seeing, speaking or moving  D.  The patient was injured during the seizure  E.  The patient has a temperature over 102 F (39C)  F.  The patient vomited and now is having trouble breathing

## 2021-04-03 ENCOUNTER — Other Ambulatory Visit: Payer: Self-pay | Admitting: Family

## 2021-04-03 DIAGNOSIS — M80052S Age-related osteoporosis with current pathological fracture, left femur, sequela: Secondary | ICD-10-CM

## 2021-04-12 ENCOUNTER — Other Ambulatory Visit: Payer: Self-pay

## 2021-04-15 ENCOUNTER — Ambulatory Visit (INDEPENDENT_AMBULATORY_CARE_PROVIDER_SITE_OTHER): Payer: Medicare Other | Admitting: Family Medicine

## 2021-04-15 ENCOUNTER — Other Ambulatory Visit: Payer: Self-pay

## 2021-04-15 ENCOUNTER — Encounter: Payer: Self-pay | Admitting: Family Medicine

## 2021-04-15 VITALS — BP 104/66 | Temp 97.8°F | Ht 62.0 in | Wt 73.0 lb

## 2021-04-15 DIAGNOSIS — R209 Unspecified disturbances of skin sensation: Secondary | ICD-10-CM | POA: Insufficient documentation

## 2021-04-15 DIAGNOSIS — I959 Hypotension, unspecified: Secondary | ICD-10-CM | POA: Insufficient documentation

## 2021-04-15 DIAGNOSIS — Z0000 Encounter for general adult medical examination without abnormal findings: Secondary | ICD-10-CM | POA: Diagnosis not present

## 2021-04-15 DIAGNOSIS — R059 Cough, unspecified: Secondary | ICD-10-CM | POA: Diagnosis not present

## 2021-04-15 DIAGNOSIS — R251 Tremor, unspecified: Secondary | ICD-10-CM | POA: Insufficient documentation

## 2021-04-15 DIAGNOSIS — G894 Chronic pain syndrome: Secondary | ICD-10-CM | POA: Insufficient documentation

## 2021-04-15 DIAGNOSIS — R482 Apraxia: Secondary | ICD-10-CM | POA: Insufficient documentation

## 2021-04-15 DIAGNOSIS — R634 Abnormal weight loss: Secondary | ICD-10-CM | POA: Diagnosis not present

## 2021-04-15 DIAGNOSIS — E43 Unspecified severe protein-calorie malnutrition: Secondary | ICD-10-CM | POA: Insufficient documentation

## 2021-04-15 LAB — COMPREHENSIVE METABOLIC PANEL
ALT: 11 U/L (ref 0–35)
AST: 20 U/L (ref 0–37)
Albumin: 4.1 g/dL (ref 3.5–5.2)
Alkaline Phosphatase: 109 U/L (ref 39–117)
BUN: 16 mg/dL (ref 6–23)
CO2: 29 mEq/L (ref 19–32)
Calcium: 9.5 mg/dL (ref 8.4–10.5)
Chloride: 104 mEq/L (ref 96–112)
Creatinine, Ser: 0.65 mg/dL (ref 0.40–1.20)
GFR: 88.19 mL/min (ref >60.00)
Glucose, Bld: 97 mg/dL (ref 70–99)
Potassium: 5.3 mEq/L — ABNORMAL HIGH (ref 3.5–5.1)
Sodium: 140 mEq/L (ref 135–145)
Total Bilirubin: 0.3 mg/dL (ref 0.2–1.2)
Total Protein: 7.9 g/dL (ref 6.0–8.3)

## 2021-04-15 LAB — CBC
HCT: 39.8 % (ref 36.0–46.0)
Hemoglobin: 13.3 g/dL (ref 12.0–15.0)
MCHC: 33.4 g/dL (ref 30.0–36.0)
MCV: 95.9 fl (ref 78.0–100.0)
Platelets: 169 10*3/uL (ref 150.0–400.0)
RBC: 4.15 Mil/uL (ref 3.87–5.11)
RDW: 12.8 % (ref 11.5–15.5)
WBC: 4.7 10*3/uL (ref 4.0–10.5)

## 2021-04-15 LAB — LIPID PANEL
Cholesterol: 195 mg/dL (ref 0–200)
HDL: 53.1 mg/dL (ref >39.00)
LDL Cholesterol: 123 mg/dL — ABNORMAL HIGH (ref 0–99)
NonHDL: 141.8
Total CHOL/HDL Ratio: 4
Triglycerides: 96 mg/dL (ref 0.0–149.0)
VLDL: 19.2 mg/dL (ref 0.0–40.0)

## 2021-04-15 NOTE — Progress Notes (Signed)
Established Patient Office Visit  Subjective:  Patient ID: Gabrielle Berry, female    DOB: July 09, 1951  Age: 70 y.o. MRN: [id number]  CC:  Chief Complaint  Patient presents with  . Follow-up    Routine follow up, patient has a lingering cough that family feels like is from BP medication.     HPI Gabrielle Berry presents for follow-up accompanied by her daughter.  Patient is doing much better.  Feels well and has a great appetite.  She has had a nonproductive cough that comes and goes.  Daughter wonders if it is related to lisinopril.  Quit smoking 48 years ago but has lived with a smoker.  Status post follow-up with neurology and they are continuing to Dilantin.  Medication has worked well for her.  Planning on attending appointments for mammogram and DEXA scan at the end of this month.  Fasting today for blood work.  Past Medical History:  Diagnosis Date  . Anxiety   . Cataract   . Guillain Barr syndrome (HCC)   . Guillain-Barre disease (HCC)   . Seizures (HCC)     Past Surgical History:  Procedure Laterality Date  . EYE SURGERY    . HIP SURGERY Right   . TUBAL LIGATION      Family History  Problem Relation Age of Onset  . Hypertension Mother   . Anxiety disorder Mother   . Diabetes Father     Social History   Socioeconomic History  . Marital status: Widowed    Spouse name: Not on file  . Number of children: Not on file  . Years of education: Not on file  . Highest education level: Not on file  Occupational History  . Not on file  Tobacco Use  . Smoking status: Never Smoker  . Smokeless tobacco: Never Used  Vaping Use  . Vaping Use: Never used  Substance and Sexual Activity  . Alcohol use: Never  . Drug use: Never  . Sexual activity: Not on file  Other Topics Concern  . Not on file  Social History Narrative   Left handed    Lives with daughter      Social Determinants of Health   Financial Resource Strain: Low Risk   . Difficulty of Paying Living  Expenses: Not hard at all  Food Insecurity: No Food Insecurity  . Worried About Programme researcher, broadcasting/film/video in the Last Year: Never true  . Ran Out of Food in the Last Year: Never true  Transportation Needs: No Transportation Needs  . Lack of Transportation (Medical): No  . Lack of Transportation (Non-Medical): No  Physical Activity: Inactive  . Days of Exercise per Week: 0 days  . Minutes of Exercise per Session: 0 min  Stress: No Stress Concern Present  . Feeling of Stress : Not at all  Social Connections: Socially Isolated  . Frequency of Communication with Friends and Family: Once a week  . Frequency of Social Gatherings with Friends and Family: Never  . Attends Religious Services: Never  . Active Member of Clubs or Organizations: No  . Attends Banker Meetings: Never  . Marital Status: Widowed  Intimate Partner Violence: Not At Risk  . Fear of Current or Ex-Partner: No  . Emotionally Abused: No  . Physically Abused: No  . Sexually Abused: No    Outpatient Medications Prior to Visit  Medication Sig Dispense Refill  . calcium-vitamin D (OSCAL-500) 500-400 MG-UNIT tablet Take 1 tablet by mouth 2 (two) times  daily. 180 tablet 1  . donepezil (ARICEPT) 5 MG tablet Take 1 tablet (5 mg total) by mouth at bedtime. 90 tablet 3  . HYDROcodone-acetaminophen (NORCO/VICODIN) 5-325 MG tablet TAKE 1 TABLET BY MOUTH NIGHTLY AS NEEDED 90 tablet 0  . lisinopril (PRINIVIL) 10 MG tablet Take 0.5 tablets (5 mg total) by mouth daily. 90 tablet 1  . PARoxetine (PAXIL) 10 MG tablet TAKE 1 TABLET (10 MG TOTAL) BY MOUTH DAILY. 90 tablet 1  . phenytoin (DILANTIN) 100 MG ER capsule Take 3 capsules every night 270 capsule 3   No facility-administered medications prior to visit.    Allergies  Allergen Reactions  . Penicillins     ROS Review of Systems  Constitutional: Positive for unexpected weight change. Negative for chills, diaphoresis, fatigue and fever.  HENT: Negative.   Eyes:  Negative for photophobia and visual disturbance.  Respiratory: Positive for cough. Negative for chest tightness, shortness of breath and wheezing.   Cardiovascular: Negative.   Gastrointestinal: Negative.   Endocrine: Negative for polyphagia and polyuria.  Genitourinary: Negative.   Musculoskeletal: Negative for joint swelling and myalgias.  Neurological: Negative for light-headedness.  Psychiatric/Behavioral: Negative.       Objective:    Physical Exam Vitals and nursing note reviewed.  Constitutional:      General: She is not in acute distress.    Appearance: Normal appearance. She is not ill-appearing, toxic-appearing or diaphoretic.  HENT:     Head: Normocephalic and atraumatic.     Right Ear: Tympanic membrane, ear canal and external ear normal.     Left Ear: Tympanic membrane, ear canal and external ear normal.     Mouth/Throat:     Mouth: Mucous membranes are moist.     Pharynx: Oropharynx is clear. No oropharyngeal exudate or posterior oropharyngeal erythema.  Eyes:     General:        Right eye: No discharge.        Left eye: No discharge.     Extraocular Movements: Extraocular movements intact.     Conjunctiva/sclera: Conjunctivae normal.     Pupils: Pupils are equal, round, and reactive to light.  Neck:     Vascular: No carotid bruit.  Cardiovascular:     Rate and Rhythm: Normal rate and regular rhythm.     Pulses:          Carotid pulses are 2+ on the right side and 2+ on the left side. Pulmonary:     Effort: Pulmonary effort is normal.     Breath sounds: Examination of the right-upper field reveals decreased breath sounds. Examination of the right-middle field reveals decreased breath sounds. Examination of the right-lower field reveals decreased breath sounds. Decreased breath sounds present.  Abdominal:     General: Bowel sounds are normal.  Musculoskeletal:     Cervical back: No rigidity or tenderness.  Lymphadenopathy:     Cervical: No cervical  adenopathy.  Skin:    General: Skin is warm and dry.  Neurological:     Mental Status: She is oriented to person, place, and time.  Psychiatric:        Mood and Affect: Mood normal.        Behavior: Behavior normal.     BP 104/66   Temp 97.8 F (36.6 C) (Temporal)   Ht 5\' 2"  (1.575 m)   Wt 73 lb (33.1 kg)   LMP  (LMP Unknown)   BMI 13.35 kg/m  Wt Readings from Last 3 Encounters:  04/15/21  73 lb (33.1 kg)  03/27/21 85 lb (38.6 kg)  01/14/21 78 lb (35.4 kg)     Health Maintenance Due  Topic Date Due  . Hepatitis C Screening  Never done  . TETANUS/TDAP  Never done  . COLONOSCOPY (Pts 45-42yrs Insurance coverage will need to be confirmed)  Never done  . MAMMOGRAM  Never done  . DEXA SCAN  Never done  . PNA vac Low Risk Adult (2 of 2 - PPSV23) 05/15/2018    There are no preventive care reminders to display for this patient.  Lab Results  Component Value Date   TSH 1.38 06/27/2020   Lab Results  Component Value Date   WBC 3.6 (L) 01/14/2021   HGB 14.2 01/14/2021   HCT 43.0 01/14/2021   MCV 95.1 01/14/2021   PLT 142 (L) 01/14/2021   Lab Results  Component Value Date   NA 134 (L) 01/14/2021   K 5.1 01/14/2021   CO2 26 01/14/2021   GLUCOSE 98 01/14/2021   BUN 14 01/14/2021   CREATININE 0.71 01/14/2021   BILITOT 0.1 (L) 01/14/2021   ALKPHOS 110 01/14/2021   AST 43 (H) 01/14/2021   ALT 25 01/14/2021   PROT 8.2 (H) 01/14/2021   ALBUMIN 4.0 01/14/2021   CALCIUM 8.5 (L) 01/14/2021   ANIONGAP 8 01/14/2021   GFR 68.67 06/27/2020   No results found for: CHOL No results found for: HDL No results found for: LDLCALC No results found for: TRIG No results found for: CHOLHDL No results found for: FYBO1B    Assessment & Plan:   Problem List Items Addressed This Visit      Cardiovascular and Mediastinum   Hypotension   Relevant Orders   Comprehensive metabolic panel   Urinalysis, Routine w reflex microscopic     Other   Weight loss   Healthcare  maintenance   Relevant Orders   Lipid panel   Cough - Primary   Relevant Orders   CBC   DG Chest 2 View      No orders of the defined types were placed in this encounter.   Follow-up: Return in about 1 month (around 05/16/2021).  Daughter will check on patient's pneumonia vaccine status.  Will return for chest x-ray.  Fasting labs today.  Encouraged dietary augmentation with Ensure.  Patient has lost 12 pounds by our scales.  We discussed colonoscopy patient declines for now.  [name], MD

## 2021-04-16 ENCOUNTER — Telehealth: Payer: Self-pay

## 2021-04-16 NOTE — Telephone Encounter (Signed)
Pt was seen on 04/15/21. There was a question if patient had rec'd her pneumonia shot. Daughter, [name],  says she has not. Daughter also said that patient is having frequent anxiety attacks and she does not feel that Paxil is working. She wants to know if something else can be prescribed.  Please advise Thank you

## 2021-04-16 NOTE — Telephone Encounter (Signed)
Patients daughter aware of message below. Appointment scheduled for chest xray

## 2021-04-16 NOTE — Telephone Encounter (Signed)
She was doing so well when I saw her the other day. Be sure to come back for CXR when Angie returns. Scheduled to see me in a month. We can discuss this then.

## 2021-04-16 NOTE — Telephone Encounter (Signed)
Please advise message below  °

## 2021-04-18 ENCOUNTER — Other Ambulatory Visit (INDEPENDENT_AMBULATORY_CARE_PROVIDER_SITE_OTHER): Payer: Medicare Other

## 2021-04-18 ENCOUNTER — Ambulatory Visit (INDEPENDENT_AMBULATORY_CARE_PROVIDER_SITE_OTHER): Payer: Medicare Other

## 2021-04-18 ENCOUNTER — Telehealth: Payer: Self-pay

## 2021-04-18 ENCOUNTER — Other Ambulatory Visit: Payer: Self-pay

## 2021-04-18 DIAGNOSIS — R059 Cough, unspecified: Secondary | ICD-10-CM

## 2021-04-18 NOTE — Progress Notes (Signed)
Per orders of Dr. Kremer pt is here for labs, pt tolerated lab draw well.  

## 2021-04-18 NOTE — Telephone Encounter (Signed)
Patient and daughter in the office today for chest xray. Per daughter patient anxiety got the best of her during the chest xray and this seems to be pretty frequent now a days. She states that she would like to try something else other than Paxil because this does not sem to helping at all and they would like something before next visit if possible. Please advise.

## 2021-04-18 NOTE — Telephone Encounter (Signed)
There was no mention of her anxiety in our recent office visit. I think  that Paxil is the right choice for her. I will be willing to increase the dose. They are welcome to return for a follow up visit prior to next scheduled visit if they like.

## 2021-04-19 NOTE — Telephone Encounter (Signed)
Spoke with patients daughter who verbally understood medication could be increase but patient will need a follow up visit. Appointment scheduled.

## 2021-04-19 NOTE — Telephone Encounter (Signed)
Pt daughter calling asking if Brendia Sacks could call her back at [phone]

## 2021-04-23 ENCOUNTER — Encounter: Payer: Self-pay | Admitting: Family Medicine

## 2021-04-23 ENCOUNTER — Telehealth (INDEPENDENT_AMBULATORY_CARE_PROVIDER_SITE_OTHER): Payer: Medicare Other | Admitting: Family Medicine

## 2021-04-23 ENCOUNTER — Telehealth: Payer: Self-pay | Admitting: Family Medicine

## 2021-04-23 VITALS — Ht 62.0 in | Wt 74.0 lb

## 2021-04-23 DIAGNOSIS — M80052S Age-related osteoporosis with current pathological fracture, left femur, sequela: Secondary | ICD-10-CM | POA: Diagnosis not present

## 2021-04-23 DIAGNOSIS — J449 Chronic obstructive pulmonary disease, unspecified: Secondary | ICD-10-CM

## 2021-04-23 DIAGNOSIS — M810 Age-related osteoporosis without current pathological fracture: Secondary | ICD-10-CM

## 2021-04-23 DIAGNOSIS — G894 Chronic pain syndrome: Secondary | ICD-10-CM

## 2021-04-23 DIAGNOSIS — F419 Anxiety disorder, unspecified: Secondary | ICD-10-CM

## 2021-04-23 MED ORDER — HYDROCODONE-ACETAMINOPHEN 5-325 MG PO TABS: ORAL_TABLET | ORAL | 0 refills | Status: DC

## 2021-04-23 MED ORDER — PAROXETINE HCL 20 MG PO TABS: 20.0000 mg | ORAL_TABLET | Freq: Every day | ORAL | 1 refills | Status: DC

## 2021-04-23 MED ORDER — ALENDRONATE SODIUM 70 MG PO TABS: 70.0000 mg | ORAL_TABLET | ORAL | 11 refills | Status: DC

## 2021-04-23 NOTE — Telephone Encounter (Signed)
The pharmacy called about the HYDROcodone-acetaminophen (NORCO/VICODIN) 5-325 MG tablet sent in today, she said that it was sent in for a 90 day and they cant dispense for that much but that they can go ahead and fill for a 30 day but that you would just need to send in a new script each month

## 2021-04-23 NOTE — Progress Notes (Signed)
Established Patient Office Visit  Subjective:  Patient ID: Gabrielle Berry, female    DOB: 1951-06-19  Age: 70 y.o. MRN: [id number]  CC:  Chief Complaint  Patient presents with  . Anxiety    Concerns about frequent anxiety attacks. Would like medication increased would also like to discuss xray with COPD.     HPI Gabrielle Berry presents for follow up of anxiety. Is nervous when leaving the home. Using norco only at night and as needed. Last rx was 5 mos ago. Ho osteoporetic bone fracture with chronic pain. Daughter checked and patient has never been treated for osteoporosis. She does take oscal with 25mg  Vit D3 daily.she has never had the pneumonia vaccine. Quit tobacco 47 years ago. Recent cxr shows copd. Ho Guillain Beret syndrome.   Past Medical History:  Diagnosis Date  . Anxiety   . Cataract   . Guillain Barr syndrome (HCC)   . Guillain-Barre disease (HCC)   . Seizures (HCC)     Past Surgical History:  Procedure Laterality Date  . EYE SURGERY    . HIP SURGERY Right   . TUBAL LIGATION      Family History  Problem Relation Age of Onset  . Hypertension Mother   . Anxiety disorder Mother   . Diabetes Father     Social History   Socioeconomic History  . Marital status: Widowed    Spouse name: Not on file  . Number of children: Not on file  . Years of education: Not on file  . Highest education level: Not on file  Occupational History  . Not on file  Tobacco Use  . Smoking status: Never Smoker  . Smokeless tobacco: Never Used  Vaping Use  . Vaping Use: Never used  Substance and Sexual Activity  . Alcohol use: Never  . Drug use: Never  . Sexual activity: Not on file  Other Topics Concern  . Not on file  Social History Narrative   Left handed    Lives with daughter      Social Determinants of Health   Financial Resource Strain: Low Risk   . Difficulty of Paying Living Expenses: Not hard at all  Food Insecurity: No Food Insecurity  . Worried About  in the Last Year: Never true  . Ran Out of Food in the Last Year: Never true  Transportation Needs: No Transportation Needs  . Lack of Transportation (Medical): No  . Lack of Transportation (Non-Medical): No  Physical Activity: Inactive  . Days of Exercise per Week: 0 days  . Minutes of Exercise per Session: 0 min  Stress: No Stress Concern Present  . Feeling of Stress : Not at all  Social Connections: Socially Isolated  . Frequency of Communication with Friends and Family: Once a week  . Frequency of Social Gatherings with Friends and Family: Never  . Attends Religious Services: Never  . Active Member of Clubs or Organizations: No  . Attends Programme researcher, broadcasting/film/video Meetings: Never  . Marital Status: Widowed  Intimate Partner Violence: Not At Risk  . Fear of Current or Ex-Partner: No  . Emotionally Abused: No  . Physically Abused: No  . Sexually Abused: No    Outpatient Medications Prior to Visit  Medication Sig Dispense Refill  . calcium-vitamin D (OSCAL-500) 500-400 MG-UNIT tablet Take 1 tablet by mouth 2 (two) times daily. 180 tablet 1  . donepezil (ARICEPT) 5 MG tablet Take 1 tablet (5 mg total) by mouth at bedtime.  90 tablet 3  . phenytoin (DILANTIN) 100 MG ER capsule Take 3 capsules every night 270 capsule 3  . HYDROcodone-acetaminophen (NORCO/VICODIN) 5-325 MG tablet TAKE 1 TABLET BY MOUTH NIGHTLY AS NEEDED 90 tablet 0  . PARoxetine (PAXIL) 10 MG tablet TAKE 1 TABLET (10 MG TOTAL) BY MOUTH DAILY. 90 tablet 1  . lisinopril (PRINIVIL) 10 MG tablet Take 0.5 tablets (5 mg total) by mouth daily. (Patient not taking: Reported on 04/23/2021) 90 tablet 1   No facility-administered medications prior to visit.    Allergies  Allergen Reactions  . Penicillins     ROS Review of Systems  Constitutional: Negative for diaphoresis, fatigue, fever and unexpected weight change.  HENT: Negative.   Respiratory: Negative for chest tightness, shortness of breath and  wheezing.   Musculoskeletal: Positive for arthralgias.  Psychiatric/Behavioral: The patient is nervous/anxious.       Objective:    Physical Exam Constitutional:      Appearance: Normal appearance. She is normal weight.  HENT:     Head: Normocephalic and atraumatic.  Eyes:     Conjunctiva/sclera: Conjunctivae normal.  Pulmonary:     Effort: Pulmonary effort is normal.  Neurological:     Mental Status: She is alert and oriented to person, place, and time.  Psychiatric:        Mood and Affect: Mood normal.        Behavior: Behavior normal.     Ht 5\' 2"  (1.575 m)   Wt 74 lb (33.6 kg)   LMP  (LMP Unknown)   BMI 13.53 kg/m  Wt Readings from Last 3 Encounters:  04/23/21 74 lb (33.6 kg)  04/15/21 73 lb (33.1 kg)  03/27/21 85 lb (38.6 kg)     Health Maintenance Due  Topic Date Due  . Hepatitis C Screening  Never done  . TETANUS/TDAP  Never done  . MAMMOGRAM  Never done  . DEXA SCAN  Never done  . PNA vac Low Risk Adult (2 of 2 - PPSV23) 05/15/2018    There are no preventive care reminders to display for this patient.  Lab Results  Component Value Date   TSH 1.38 06/27/2020   Lab Results  Component Value Date   WBC 4.7 04/15/2021   HGB 13.3 04/15/2021   HCT 39.8 04/15/2021   MCV 95.9 04/15/2021   PLT 169.0 04/15/2021   Lab Results  Component Value Date   NA 140 04/15/2021   K 5.3 (H) 04/15/2021   CO2 29 04/15/2021   GLUCOSE 97 04/15/2021   BUN 16 04/15/2021   CREATININE 0.65 04/15/2021   BILITOT 0.3 04/15/2021   ALKPHOS 109 04/15/2021   AST 20 04/15/2021   ALT 11 04/15/2021   PROT 7.9 04/15/2021   ALBUMIN 4.1 04/15/2021   CALCIUM 9.5 04/15/2021   ANIONGAP 8 01/14/2021   GFR 88.19 04/15/2021   Lab Results  Component Value Date   CHOL 195 04/15/2021   Lab Results  Component Value Date   HDL 53.10 04/15/2021   Lab Results  Component Value Date   LDLCALC 123 (H) 04/15/2021   Lab Results  Component Value Date   TRIG 96.0 04/15/2021   Lab  Results  Component Value Date   CHOLHDL 4 04/15/2021   No results found for: HGBA1C    Assessment & Plan:   Problem List Items Addressed This Visit      Respiratory   Chronic obstructive pulmonary disease (HCC)     Musculoskeletal and Integument   Senile  osteoporosis - Primary   Relevant Medications   alendronate (FOSAMAX) 70 MG tablet   Osteoporotic fracture of left hip (HCC)   Relevant Medications   HYDROcodone-acetaminophen (NORCO/VICODIN) 5-325 MG tablet   alendronate (FOSAMAX) 70 MG tablet     Other   Anxiety   Relevant Medications   PARoxetine (PAXIL) 20 MG tablet   Chronic pain syndrome   Relevant Medications   HYDROcodone-acetaminophen (NORCO/VICODIN) 5-325 MG tablet   PARoxetine (PAXIL) 20 MG tablet      Meds ordered this encounter  Medications  . HYDROcodone-acetaminophen (NORCO/VICODIN) 5-325 MG tablet    Sig: TAKE 1 TABLET BY MOUTH NIGHTLY AS NEEDED    Dispense:  90 tablet    Refill:  0  . PARoxetine (PAXIL) 20 MG tablet    Sig: Take 1 tablet (20 mg total) by mouth daily.    Dispense:  90 tablet    Refill:  1  . alendronate (FOSAMAX) 70 MG tablet    Sig: Take 1 tablet (70 mg total) by mouth every 7 (seven) days. Take with a full glass of water on an empty stomach.    Dispense:  4 tablet    Refill:  11    Follow-up: Return in about 3 months (around 07/24/2021).   Will start fosamax. Bone densiometry ordered. Continue norco PRN at night. Increase Paxil to 20mg . Use walker at all times. Will need pneumovax next visit. Let me know about any sob or doe.  , MD   Virtual Visit via Video Note  I connected with Holden Draughon on 04/23/21 at 11:30 AM EDT by a video enabled telemedicine application and verified that I am speaking with the correct person using two identifiers.  Location: Patient: at home with her daughter. Provider: home   I discussed the limitations of evaluation and management by telemedicine and the availability of  in person appointments. The patient expressed understanding and agreed to proceed.  History of Present Illness:    Observations/Objective:   Assessment and Plan:   Follow Up Instructions:    I discussed the assessment and treatment plan with the patient. The patient was provided an opportunity to ask questions and all were answered. The patient agreed with the plan and demonstrated an understanding of the instructions.   The patient was advised to call back or seek an in-person evaluation if the symptoms worsen or if the condition fails to improve as anticipated.  I provided 35 minutes of non-face-to-face time during this encounter.   06/23/21, MD

## 2021-04-24 MED ORDER — HYDROCODONE-ACETAMINOPHEN 5-325 MG PO TABS: ORAL_TABLET | ORAL | 0 refills | Status: DC

## 2021-04-24 NOTE — Telephone Encounter (Signed)
okay

## 2021-05-14 ENCOUNTER — Other Ambulatory Visit: Payer: Medicare Other

## 2021-05-14 ENCOUNTER — Inpatient Hospital Stay: Admission: RE | Admit: 2021-05-14 | Payer: Medicare Other | Source: Ambulatory Visit

## 2021-06-22 ENCOUNTER — Other Ambulatory Visit: Payer: Self-pay

## 2021-06-22 ENCOUNTER — Emergency Department (HOSPITAL_BASED_OUTPATIENT_CLINIC_OR_DEPARTMENT_OTHER)
Admission: EM | Admit: 2021-06-22 | Discharge: 2021-06-22 | Disposition: A | Discharge status: Home / Self Care | Payer: Medicare Other | Attending: Emergency Medicine | Admitting: Emergency Medicine

## 2021-06-22 ENCOUNTER — Emergency Department (HOSPITAL_BASED_OUTPATIENT_CLINIC_OR_DEPARTMENT_OTHER): Payer: Medicare Other

## 2021-06-22 ENCOUNTER — Encounter (HOSPITAL_BASED_OUTPATIENT_CLINIC_OR_DEPARTMENT_OTHER): Payer: Self-pay | Admitting: Emergency Medicine

## 2021-06-22 DIAGNOSIS — J449 Chronic obstructive pulmonary disease, unspecified: Secondary | ICD-10-CM | POA: Diagnosis not present

## 2021-06-22 DIAGNOSIS — S52501A Unspecified fracture of the lower end of right radius, initial encounter for closed fracture: Secondary | ICD-10-CM | POA: Insufficient documentation

## 2021-06-22 DIAGNOSIS — W19XXXA Unspecified fall, initial encounter: Secondary | ICD-10-CM | POA: Insufficient documentation

## 2021-06-22 DIAGNOSIS — Z79899 Other long term (current) drug therapy: Secondary | ICD-10-CM | POA: Insufficient documentation

## 2021-06-22 DIAGNOSIS — F0280 Dementia in other diseases classified elsewhere without behavioral disturbance: Secondary | ICD-10-CM | POA: Diagnosis not present

## 2021-06-22 DIAGNOSIS — G309 Alzheimer's disease, unspecified: Secondary | ICD-10-CM | POA: Insufficient documentation

## 2021-06-22 DIAGNOSIS — S6991XA Unspecified injury of right wrist, hand and finger(s), initial encounter: Secondary | ICD-10-CM | POA: Diagnosis present

## 2021-06-22 DIAGNOSIS — I10 Essential (primary) hypertension: Secondary | ICD-10-CM | POA: Diagnosis not present

## 2021-06-22 DIAGNOSIS — T148XXA Other injury of unspecified body region, initial encounter: Secondary | ICD-10-CM

## 2021-06-22 DIAGNOSIS — S52531A Colles' fracture of right radius, initial encounter for closed fracture: Secondary | ICD-10-CM

## 2021-06-22 LAB — CBC WITH DIFFERENTIAL/PLATELET
Abs Immature Granulocytes: 0.01 10*3/uL (ref 0.00–0.07)
Basophils Absolute: 0 10*3/uL (ref 0.0–0.1)
Basophils Relative: 0 %
Eosinophils Absolute: 0.1 10*3/uL (ref 0.0–0.5)
Eosinophils Relative: 1 %
HCT: 40.2 % (ref 36.0–46.0)
Hemoglobin: 13.4 g/dL (ref 12.0–15.0)
Immature Granulocytes: 0 %
Lymphocytes Relative: 28 %
Lymphs Abs: 1.8 10*3/uL (ref 0.7–4.0)
MCH: 32.1 pg (ref 26.0–34.0)
MCHC: 33.3 g/dL (ref 30.0–36.0)
MCV: 96.4 fL (ref 80.0–100.0)
Monocytes Absolute: 0.8 10*3/uL (ref 0.1–1.0)
Monocytes Relative: 13 %
Neutro Abs: 3.7 10*3/uL (ref 1.7–7.7)
Neutrophils Relative %: 58 %
Platelets: 215 10*3/uL (ref 150–400)
RBC: 4.17 MIL/uL (ref 3.87–5.11)
RDW: 13.3 % (ref 11.5–15.5)
Smear Review: NORMAL
WBC Morphology: >10
WBC: 6.4 10*3/uL (ref 4.0–10.5)
nRBC: 0 % (ref 0.0–0.2)

## 2021-06-22 LAB — BASIC METABOLIC PANEL
Anion gap: 8 (ref 5–15)
BUN: 15 mg/dL (ref 8–23)
CO2: 27 mmol/L (ref 22–32)
Calcium: 8.8 mg/dL — ABNORMAL LOW (ref 8.9–10.3)
Chloride: 101 mmol/L (ref 98–111)
Creatinine, Ser: 0.59 mg/dL (ref 0.44–1.00)
GFR, Estimated: >60 mL/min (ref >60)
Glucose, Bld: 145 mg/dL — ABNORMAL HIGH (ref 70–99)
Potassium: 5.1 mmol/L (ref 3.5–5.1)
Sodium: 136 mmol/L (ref 135–145)

## 2021-06-22 LAB — URINALYSIS, ROUTINE W REFLEX MICROSCOPIC
Bilirubin Urine: NEGATIVE
Glucose, UA: NEGATIVE mg/dL
Hgb urine dipstick: NEGATIVE
Ketones, ur: NEGATIVE mg/dL
Nitrite: NEGATIVE
Protein, ur: NEGATIVE mg/dL
Specific Gravity, Urine: <1.005 — ABNORMAL LOW (ref 1.005–1.030)
pH: 7 (ref 5.0–8.0)

## 2021-06-22 LAB — URINALYSIS, MICROSCOPIC (REFLEX)

## 2021-06-22 MED ORDER — LIDOCAINE HCL (PF) 1 % IJ SOLN
10.0000 mL | Freq: Once | INTRAMUSCULAR | Status: AC
  Administered 2021-06-22: 10 mL
  Filled 2021-06-22: qty 10

## 2021-06-22 MED ORDER — BUPIVACAINE HCL (PF) 0.5 % IJ SOLN
20.0000 mL | Freq: Once | INTRAMUSCULAR | Status: AC
  Administered 2021-06-22: 10 mL
  Filled 2021-06-22: qty 20

## 2021-06-22 NOTE — ED Notes (Signed)
Pt wheeled to waiting room. Pt verbalized understanding of discharge instructions.   

## 2021-06-22 NOTE — ED Triage Notes (Signed)
Pt arrives pov with c/o mechanical fall pt. Pt c/o R wrist pain, swelling noted. Pt endorses taking Hydrocodone 500mg  45 minutes pta. Pt denies hitting head

## 2021-06-22 NOTE — ED Provider Notes (Signed)
[facility] EMERGENCY DEPARTMENT Provider Note   CSN: [id number] Arrival date & time: 06/22/21  1256     History Chief Complaint  Patient presents with   Fall   Wrist Pain    Gabrielle Berry is a 70 y.o. female.  Patient is a 70 year old female who presents with wrist pain after a fall.  She has a history of Guillain-Barr, COPD, hypertension and dementia.  She lives at home with her daughter.  Her daughter says that she normally has trouble lifting her feet up high enough given her Guillain-Barr and falls related to that.  She thinks this is what happened today and the patient fell over onto some carpet landed on her right wrist.  She did not hit her head.  There is no loss of consciousness.  However the patient says that she got dizzy and fell.  She denies any current dizziness.  No chest pain or shortness of breath.  No palpitations.  No nausea or vomiting or diarrhea.  No urinary symptoms.  She denies any other injuries from the fall.      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   Skin sensation disturbance 04/15/2021   Tremor 04/15/2021   Unspecified severe protein-calorie malnutrition (HCC) 04/15/2021   Weight loss 04/15/2021   Healthcare maintenance 04/15/2021   Hypotension 04/15/2021   Cough 04/15/2021   Senile osteoporosis 10/04/2020   Osteoporotic fracture of left hip (HCC) 10/04/2020   Seizure (HCC) 05/18/2020   Alzheimer's dementia without behavioral disturbance (HCC) 05/18/2020   Anxiety 05/18/2020   Essential hypertension 05/18/2020   History of Guillain-Barre syndrome 05/18/2020   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     Family History  Problem Relation Age of Onset   Hypertension Mother    Anxiety disorder Mother    Diabetes Father     Social History   Tobacco Use   Smoking status: Never   Smokeless tobacco: Never  Vaping Use   Vaping Use: Never used  Substance Use Topics   Alcohol use: Never   Drug use: Never    Home Medications Prior to Admission medications   Medication Sig Start Date End Date Taking? Authorizing Provider  alendronate (FOSAMAX) 70 MG tablet Take 1 tablet (70 mg total) by mouth every 7 (seven) days. Take with a full glass of water on an empty stomach. 04/23/21   [name], MD  calcium-vitamin D (OSCAL-500) 500-400 MG-UNIT tablet Take 1 tablet by mouth 2 (two) times daily. 10/04/20   [name], MD  donepezil (ARICEPT) 5 MG tablet Take 1 tablet (5 mg total) by mouth at bedtime. 03/27/21   Van Clines, MD  HYDROcodone-acetaminophen (NORCO/VICODIN) 5-325 MG tablet TAKE 1 TABLET BY MOUTH NIGHTLY AS NEEDED 04/24/21   [name], MD  PARoxetine (PAXIL) 20 MG tablet Take 1 tablet (20 mg total) by mouth daily. 04/23/21   [name], MD  phenytoin (  DILANTIN) 100 MG ER capsule Take 3 capsules every night 03/27/21   Van Clines, MD    Allergies    Penicillins  Review of Systems   Review of Systems  Constitutional:  Negative for chills, diaphoresis, fatigue and fever.  HENT:  Negative for congestion, rhinorrhea and sneezing.   Eyes: Negative.   Respiratory:  Negative for cough, chest tightness and shortness of breath.   Cardiovascular:  Negative for chest pain and leg swelling.  Gastrointestinal:  Negative for abdominal pain, blood in stool, diarrhea, nausea and vomiting.  Genitourinary:  Negative for difficulty urinating, flank pain, frequency and hematuria.  Musculoskeletal:  Positive  for arthralgias. Negative for back pain.  Skin:  Negative for rash.  Neurological:  Positive for dizziness. Negative for speech difficulty, weakness, numbness and headaches.   Physical Exam Updated Vital Signs BP (!) 168/89 (BP Location: Left Arm)   Pulse 88   Temp 98.5 F (36.9 C) (Oral)   Resp 18   Ht 5\' 2"  (1.575 m)   Wt 36.3 kg   LMP  (LMP Unknown)   SpO2 99%   BMI 14.63 kg/m   Physical Exam Constitutional:      Appearance: She is well-developed.  HENT:     Head: Normocephalic and atraumatic.  Eyes:     Pupils: Pupils are equal, round, and reactive to light.     Comments: No nystagmus  Cardiovascular:     Rate and Rhythm: Normal rate and regular rhythm.     Heart sounds: Normal heart sounds.  Pulmonary:     Effort: Pulmonary effort is normal. No respiratory distress.     Breath sounds: Normal breath sounds. No wheezing or rales.  Chest:     Chest wall: No tenderness.  Abdominal:     General: Bowel sounds are normal.     Palpations: Abdomen is soft.     Tenderness: There is no abdominal tenderness. There is no guarding or rebound.  Musculoskeletal:        General: Normal range of motion.     Cervical back: Normal range of motion and neck supple.     Comments: Positive swelling and deformity to the right wrist.  There is no pain to the elbow or shoulder.  No pain along the spine.  No other pain on palpation or range of motion of the extremities  Lymphadenopathy:     Cervical: No cervical adenopathy.  Skin:    General: Skin is warm and dry.     Findings: No rash.  Neurological:     General: No focal deficit present.     Mental Status: She is alert and oriented to person, place, and time.    ED Results / Procedures / Treatments   Labs (all labs ordered are listed, but only abnormal results are displayed) Labs Reviewed  BASIC METABOLIC PANEL - Abnormal; Notable for the following components:      Result Value   Glucose, Bld 145 (*)    Calcium 8.8 (*)    All  other components within normal limits  CBC WITH DIFFERENTIAL/PLATELET  URINALYSIS, ROUTINE W REFLEX MICROSCOPIC    EKG EKG Interpretation  Date/Time:  Saturday June 22 2021 14:17:47 EDT Ventricular Rate:  87 PR Interval:  147 QRS Duration: 89 QT Interval:  346 QTC Calculation: 417 R Axis:   26 Text Interpretation: Sinus rhythm Biatrial enlargement RSR' in V1 or V2, right VCD or RVH Nonspecific T abnormalities, lateral leads since last tracing no significant change Confirmed  by Rolan Bucco [phone]) on 06/22/2021 2:36:29 PM  Radiology DG Wrist Complete Right  Result Date: 06/22/2021 CLINICAL DATA:  Right wrist pain after fall today. Initial encounter. EXAM: RIGHT WRIST - COMPLETE 3+ VIEW COMPARISON:  None. FINDINGS: The patient has an acute distal radius fracture with impaction of approximately 1 cm, dorsal displacement of 0.5 cm and dorsal angulation of approximately 30 degrees. Also seen is a mildly displaced fracture of the ulnar styloid. Bones are osteopenic. There is soft tissue swelling about the wrist from the patient's fractures. IMPRESSION: Acute distal radius and ulnar styloid fractures as described above. Electronically Signed   By: Drusilla Kanner M.D.   On: 06/22/2021 13:54    Procedures Reduction of fracture  Date/Time: 06/22/2021 3:05 PM Performed by: Rolan Bucco, MD Authorized by: Rolan Bucco, MD  Consent: Written consent obtained. Risks and benefits: risks, benefits and alternatives were discussed Consent given by: guardian (pt's daughter) Patient understanding: patient states understanding of the procedure being performed Patient consent: the patient's understanding of the procedure matches consent given Procedure consent: procedure consent matches procedure scheduled Relevant documents: relevant documents present and verified Site marked: the operative site was not marked Imaging studies: imaging studies not available Patient identity confirmed: verbally with  patient Local anesthesia used: yes Anesthesia: hematoma block  Anesthesia: Local anesthesia used: yes Local Anesthetic: lidocaine 1% without epinephrine and bupivacaine 0.25% without epinephrine Anesthetic total: 10 mL  Sedation: Patient sedated: no  Patient tolerance: patient tolerated the procedure well with no immediate complications     Medications Ordered in ED Medications  bupivacaine (MARCAINE) 0.5 % injection 20 mL (10 mLs Infiltration Given 06/22/21 1407)  lidocaine (PF) (XYLOCAINE) 1 % injection 10 mL (10 mLs Infiltration Given 06/22/21 1407)    ED Course  I have reviewed the triage vital signs and the nursing notes.  Pertinent labs & imaging results that were available during my care of the patient were reviewed by me and considered in my medical decision making (see chart for details).    MDM Rules/Calculators/A&P                          Patient presents after a fall.  There was some discrepancy about whether it was a mechanical fall or she got dizzy and fell.  However given the question of dizziness, labs and urine were ordered.  Her EKG shows a sinus rhythm with no ectopy.  She has a distal radius fracture.  I attempted a reduction after hematoma block.  There was minimal movement.  She was placed in a sugar-tong splint.  Her labs are pending.  Dr. Rush Landmark to follow and discharge if they appear to be nonconcerning.  She is currently at baseline and will be discharged with the care of her daughter.  She will be given information about following up with hand surgery. Final Clinical Impression(s) / ED Diagnoses Final diagnoses:  Fracture  Fall  Closed Colles' fracture of right radius, initial encounter    Rx / DC Orders ED Discharge Orders     None        Rolan Bucco, MD 06/22/21 1523

## 2021-06-22 NOTE — ED Provider Notes (Signed)
3:26 PM Care assumed from Dr. Fredderick Phenix.  At time of transfer care, patient has already had a reduction attempt performed in the emergency department and has been splinted for her wrist fracture.  Plan of care is to wait for the urinalysis and the other labs.  If there are no significant concerning findings, plan of care will be to discharge home.  If there is UTI discovered, anticipate discharge with antibiotics.  Family was in agreement with this plan per previous team report.  5:39 PM Urinalysis returned with no evidence of nitrites.  There are some leukocytes and bacteria but patient fervently denies any symptoms.  Both she and family agree to wait on a urine culture as opposed to treating with antibiotics at this time.  Patient will follow-up with hand surgery and understood return precautions and follow-up instructions.  She reports she has ample pain medicine at home to treat the discomfort.  She noted questions or concerns and was discharged in good condition.    Clinical Impression: 1. Closed Colles' fracture of right radius, initial encounter   2. Fracture   3. Fall     Disposition: Discharge  Condition: Good  I have discussed the results, Dx and Tx plan with the pt(& family if present). He/she/they expressed understanding and agree(s) with the plan. Discharge instructions discussed at great length. Strict return precautions discussed and pt &/or family have verbalized understanding of the instructions. No further questions at time of discharge.    New Prescriptions   No medications on file    Follow Up: Mack Hook, MD [address] [place] [address] [phone]  Schedule an appointment as soon as possible for a visit    [facility]PART[facility] [address] [id number] [place] [address] [phone]       Jef Futch, Canary Brim, MD 06/22/21 1742

## 2021-06-22 NOTE — ED Notes (Signed)
States she fell at home today "my knees buckled", pain at right wrist, marked swelling noted, color WNL, capillary refill WNL, temp WNL, states fingers have some "tingling sensation" is noted, 2+ rt radial pulse easily palpated, can grip nurses hand and move fingers, but movement does increase pain. Ice pack and elevation implemented

## 2021-06-22 NOTE — ED Notes (Signed)
Unable to rate pain on 0-10 scale, pt does state that pain is at its worse when attempting to move wrist and or fingers

## 2021-06-22 NOTE — Discharge Instructions (Addendum)
Your imaging today revealed right wrist fracture.  Please follow-up with the hand surgery team for further management.  Please use your pain medicine at home.  We did send a urine culture as we discussed but please follow-up on these results and if it is positive anticipate they will call you.  Please follow-up with your primary doctor as well.  If any symptoms change or worsen, please return to the nearest emergency department.

## 2021-06-24 LAB — URINE CULTURE: Culture: <10000 — AB

## 2021-06-25 ENCOUNTER — Telehealth: Payer: Self-pay | Admitting: Family Medicine

## 2021-06-25 LAB — PATHOLOGIST SMEAR REVIEW: Path Review: NORMAL

## 2021-06-25 NOTE — Telephone Encounter (Signed)
Pt's daughter(Elizabeth) is wanting to start the process for getting her mom a wheelchair. Please advise [name] on how to start this process.

## 2021-06-26 NOTE — Telephone Encounter (Signed)
Returned patients daughter call, no answer left message asking to give [place] a call to schedule appointment for evaluation for possible wheelchair for patient

## 2021-08-09 ENCOUNTER — Telehealth: Payer: Self-pay | Admitting: Family Medicine

## 2021-08-09 NOTE — Telephone Encounter (Signed)
Please advise message below  °

## 2021-08-09 NOTE — Telephone Encounter (Signed)
Pt daughter called and wanted to know if the can get a script sent in for her boost drinks, she said they are paying out of pocket and it is getting expensive. I told them I wasn't sure if that's something that we could do but that I would send a message back to be sure. Please advise

## 2021-08-09 NOTE — Telephone Encounter (Signed)
Patient and daughter aware that Nutritional supplements are not covered by insurance.

## 2021-09-26 NOTE — Telephone Encounter (Signed)
Last OV 04/23/21 Next OV not scheduled Last refill 04/24/21 #30/0

## 2021-10-16 ENCOUNTER — Other Ambulatory Visit: Payer: Self-pay | Admitting: Family Medicine

## 2021-10-16 DIAGNOSIS — F419 Anxiety disorder, unspecified: Secondary | ICD-10-CM

## 2021-10-25 ENCOUNTER — Encounter: Payer: Self-pay | Admitting: Neurology

## 2021-11-06 ENCOUNTER — Ambulatory Visit: Payer: Medicare Other

## 2021-11-11 ENCOUNTER — Other Ambulatory Visit: Payer: Self-pay | Admitting: Family Medicine

## 2021-11-11 MED ORDER — DONEPEZIL HCL 5 MG PO TABS: 5.0000 mg | ORAL_TABLET | Freq: Every day | ORAL | 3 refills | Status: DC

## 2021-11-11 NOTE — Telephone Encounter (Signed)
Pt daughter called requesting refill for donepezil (ARICEPT) 5 MG tablet  Last dose today.Marland KitchenMarland Kitchen

## 2021-11-11 NOTE — Telephone Encounter (Signed)
Refill request for pending Rx last refill and OV 03/27/21. Please advise

## 2021-11-12 ENCOUNTER — Ambulatory Visit (INDEPENDENT_AMBULATORY_CARE_PROVIDER_SITE_OTHER): Payer: Medicare Other

## 2021-11-12 DIAGNOSIS — Z0100 Encounter for examination of eyes and vision without abnormal findings: Secondary | ICD-10-CM

## 2021-11-12 DIAGNOSIS — Z0000 Encounter for general adult medical examination without abnormal findings: Secondary | ICD-10-CM

## 2021-11-12 NOTE — Progress Notes (Signed)
Subjective:   Gabrielle Berry is a 70 y.o. female who presents for Medicare Annual (Subsequent) preventive examination.  I connected with Paizleigh Danzy today by telephone and verified that I am speaking with the correct person using two identifiers. Location patient: home Location provider: work Persons participating in the virtual visit: patient, provider.   I discussed the limitations, risks, security and privacy concerns of performing an evaluation and management service by telephone and the availability of in person appointments. I also discussed with the patient that there may be a patient responsible charge related to this service. The patient expressed understanding and verbally consented to this telephonic visit.    Interactive audio and video telecommunications were attempted between this provider and patient, however failed, due to patient having technical difficulties OR patient did not have access to video capability.  We continued and completed visit with audio only.    Review of Systems     Cardiac Risk Factors include: advanced age (>68men, >79 women)     Objective:    Today's Vitals   There is no height or weight on file to calculate BMI.  Advanced Directives 11/12/2021 06/22/2021 03/27/2021 01/14/2021 10/02/2020 08/27/2020  Does Patient Have a Medical Advance Directive? Yes Yes Yes Yes No Yes  Type of Estate agent of [place];Living will - Healthcare Power of Attorney - - Healthcare Power of Attorney  Does patient want to make changes to medical advance directive? - - - - No - Patient declined -  Copy of Healthcare Power of Attorney in Chart? No - copy requested - - - - -    Current Medications (verified) Outpatient Encounter Medications as of 11/12/2021  Medication Sig   alendronate (FOSAMAX) 70 MG tablet Take 1 tablet (70 mg total) by mouth every 7 (seven) days. Take with a full glass of water on an empty stomach.   calcium-vitamin D (OSCAL-500)  500-400 MG-UNIT tablet Take 1 tablet by mouth 2 (two) times daily.   donepezil (ARICEPT) 5 MG tablet Take 1 tablet (5 mg total) by mouth at bedtime.   HYDROcodone-acetaminophen (NORCO/VICODIN) 5-325 MG tablet TAKE 1 TABLET BY MOUTH NIGHTLY AS NEEDED   PARoxetine (PAXIL) 20 MG tablet TAKE 1 TABLET (20 MG TOTAL) BY MOUTH DAILY.   phenytoin (DILANTIN) 100 MG ER capsule Take 3 capsules every night   No facility-administered encounter medications on file as of 11/12/2021.    Allergies (verified) Penicillins   History: Past Medical History:  Diagnosis Date   Anxiety    Cataract    Guillain Barr syndrome (HCC)    Guillain-Barre disease (HCC)    Seizures (HCC)    Past Surgical History:  Procedure Laterality Date   EYE SURGERY     HIP SURGERY Right    TUBAL LIGATION     Family History  Problem Relation Age of Onset   Hypertension Mother    Anxiety disorder Mother    Diabetes Father    Social History   Socioeconomic History   Marital status: Widowed    Spouse name: Not on file   Number of children: Not on file   Years of education: Not on file   Highest education level: Not on file  Occupational History   Not on file  Tobacco Use   Smoking status: Never   Smokeless tobacco: Never  Vaping Use   Vaping Use: Never used  Substance and Sexual Activity   Alcohol use: Never   Drug use: Never   Sexual activity: Not  on file  Other Topics Concern   Not on file  Social History Narrative   Left handed    Lives with daughter      Social Determinants of Health   Financial Resource Strain: Low Risk    Difficulty of Paying Living Expenses: Not hard at all  Food Insecurity: No Food Insecurity   Worried About Programme researcher, broadcasting/film/video in the Last Year: Never true   Barista in the Last Year: Never true  Transportation Needs: No Transportation Needs   Lack of Transportation (Medical): No   Lack of Transportation (Non-Medical): No  Physical Activity: Inactive   Days of  Exercise per Week: 0 days   Minutes of Exercise per Session: 0 min  Stress: No Stress Concern Present   Feeling of Stress : Not at all  Social Connections: Socially Isolated   Frequency of Communication with Friends and Family: Twice a week   Frequency of Social Gatherings with Friends and Family: Twice a week   Attends Religious Services: Never   Database administrator or Organizations: No   Attends Banker Meetings: Never   Marital Status: Widowed    Tobacco Counseling Counseling given: Not Answered   Clinical Intake:  Pre-visit preparation completed: Yes  Pain : No/denies pain     Nutritional Risks: None Diabetes: No  How often do you need to have someone help you when you read instructions, pamphlets, or other written materials from your doctor or pharmacy?: 1 - Never What is the last grade level you completed in school?: 9 th grade  Diabetic?no   Interpreter Needed?: No  Information entered by :: L.Channon Brougher,LPN   Activities of Daily Living In your present state of health, do you have any difficulty performing the following activities: 11/12/2021  Hearing? N  Vision? N  Difficulty concentrating or making decisions? N  Walking or climbing stairs? N  Dressing or bathing? N  Doing errands, shopping? N  Preparing Food and eating ? N  Using the Toilet? N  In the past six months, have you accidently leaked urine? N  Do you have problems with loss of bowel control? N  Managing your Medications? N  Managing your Finances? N  Housekeeping or managing your Housekeeping? N  Some recent data might be hidden    Patient Care Team: [name], MD as PCP - General (Family Medicine) Van Clines, MD as Consulting Physician (Neurology)  Indicate any recent Medical Services you may have received from other than [facility] providers in the past year (date may be approximate).     Assessment:   This is a routine wellness examination for  [place].  Hearing/Vision screen Vision Screening - Comments:: Referral completed 11/12/2021  Dietary issues and exercise activities discussed: Current Exercise Habits: The patient does not participate in regular exercise at present   Goals Addressed             This Visit's Progress    Patient Stated   On track    Maintain or improve current health       Depression Screen PHQ 2/9 Scores 11/12/2021 11/12/2021 04/15/2021 10/02/2020 04/17/2020  PHQ - 2 Score 0 0 0 0 1    Fall Risk Fall Risk  11/12/2021 04/15/2021 03/27/2021 10/02/2020 08/27/2020  Falls in the past year? 0 0 0 0 1  Number falls in past yr: 0 - 0 0 1  Injury with Fall? 0 - 0 0 1  Risk for fall  due to : History of fall(s) - - - Impaired balance/gait;Impaired mobility  Risk for fall due to: Comment uses walker - - - -  Follow up Education provided - - Falls prevention discussed -    FALL RISK PREVENTION PERTAINING TO THE HOME:  Any stairs in or around the home? No  If so, are there any without handrails? No  Home free of loose throw rugs in walkways, pet beds, electrical cords, etc? Yes  Adequate lighting in your home to reduce risk of falls? Yes   ASSISTIVE DEVICES UTILIZED TO PREVENT FALLS:  Life alert? No  Use of a cane, walker or w/c? Yes  Grab bars in the bathroom? Yes  Shower chair or bench in shower? Yes  Elevated toilet seat or a handicapped toilet? No     Cognitive Function:  Cognitive status assessed by direct observation. Patient has current diagnosis of cognitive impairment.per daughter [name]  [place]  Patient is followed by neurology for ongoing assessment. Patient is unable to complete screening 6CIT or MMSE.     6CIT Screen 10/02/2020  What Year? 4 points  What month? 3 points  What time? 3 points  Count back from 20 0 points  Months in reverse 4 points  Repeat phrase 10 points  Total Score 24    Immunizations Immunization History  Administered Date(s) Administered    Influenza-Unspecified 09/28/2018   Pneumococcal Conjugate-13 05/15/2017    TDAP status: Due, Education has been provided regarding the importance of this vaccine. Advised may receive this vaccine at local pharmacy or Health Dept. Aware to provide a copy of the vaccination record if obtained from local pharmacy or Health Dept. Verbalized acceptance and understanding.  Flu Vaccine status: Due, Education has been provided regarding the importance of this vaccine. Advised may receive this vaccine at local pharmacy or Health Dept. Aware to provide a copy of the vaccination record if obtained from local pharmacy or Health Dept. Verbalized acceptance and understanding.  Pneumococcal vaccine status: Due, Education has been provided regarding the importance of this vaccine. Advised may receive this vaccine at local pharmacy or Health Dept. Aware to provide a copy of the vaccination record if obtained from local pharmacy or Health Dept. Verbalized acceptance and understanding.  Covid-19 vaccine status: Declined, Education has been provided regarding the importance of this vaccine but patient still declined. Advised may receive this vaccine at local pharmacy or Health Dept.or vaccine clinic. Aware to provide a copy of the vaccination record if obtained from local pharmacy or Health Dept. Verbalized acceptance and understanding.  Qualifies for Shingles Vaccine? Yes   Zostavax completed No   Shingrix Completed?: No.    Education has been provided regarding the importance of this vaccine. Patient has been advised to call insurance company to determine out of pocket expense if they have not yet received this vaccine. Advised may also receive vaccine at local pharmacy or Health Dept. Verbalized acceptance and understanding.  Screening Tests Health Maintenance  Topic Date Due   Hepatitis C Screening  Never done   TETANUS/TDAP  Never done   MAMMOGRAM  Never done   Zoster Vaccines- Shingrix (1 of 2) Never done    DEXA SCAN  Never done   Pneumonia Vaccine 31+ Years old (2 - PPSV23 if available, else PCV20) 05/15/2018   INFLUENZA VACCINE  07/15/2021   HPV VACCINES  Aged Out   COLONOSCOPY (Pts 45-60yrs Insurance coverage will need to be confirmed)  Discontinued   COVID-19 Vaccine  Discontinued  Health Maintenance  Health Maintenance Due  Topic Date Due   Hepatitis C Screening  Never done   TETANUS/TDAP  Never done   MAMMOGRAM  Never done   Zoster Vaccines- Shingrix (1 of 2) Never done   DEXA SCAN  Never done   Pneumonia Vaccine 82+ Years old (2 - PPSV23 if available, else PCV20) 05/15/2018   INFLUENZA VACCINE  07/15/2021    Colorectal cancer screening: Referral to GI placed declined per POA . Pt aware the office will call re: appt.  Mammogram status: Ordered Declined POA . Pt provided with contact info and advised to call to schedule appt.   Bone Density status: Ordered declined per POA . Pt provided with contact info and advised to call to schedule appt.  Lung Cancer Screening: (Low Dose CT Chest recommended if Age 70-80 years, 30 pack-year currently smoking OR have quit w/in 15years.) does not qualify.   Lung Cancer Screening Referral: n/a  Additional Screening:  Hepatitis C Screening: does qualify;   Vision Screening: Recommended annual ophthalmology exams for early detection of glaucoma and other disorders of the eye. Is the patient up to date with their annual eye exam?  No  Who is the provider or what is the name of the office in which the patient attends annual eye exams? Referral eye doctor 11/12/2021 If pt is not established with a provider, would they like to be referred to a provider to establish care? Yes .   Dental Screening: Recommended annual dental exams for proper oral hygiene  Community Resource Referral / Chronic Care Management: CRR required this visit?  No   CCM required this visit?  No      Plan:     I have personally reviewed and noted the following in  the patient's chart:   Medical and social history Use of alcohol, tobacco or illicit drugs  Current medications and supplements including opioid prescriptions.  Functional ability and status Nutritional status Physical activity Advanced directives List of other physicians Hospitalizations, surgeries, and ER visits in previous 12 months Vitals Screenings to include cognitive, depression, and falls Referrals and appointments  In addition, I have reviewed and discussed with patient certain preventive protocols, quality metrics, and best practice recommendations. A written personalized care plan for preventive services as well as general preventive health recommendations were provided to patient.     March Rummage, LPN   09/81/1914   Nurse Notes: none

## 2021-11-12 NOTE — Patient Instructions (Signed)
Gabrielle Berry , Thank you for taking time to come for your Medicare Wellness Visit. I appreciate your ongoing commitment to your health goals. Please review the following plan we discussed and let me know if I can assist you in the future.   Screening recommendations/referrals: Colonoscopy: declined  Mammogram: declined  Bone Density: declined  Recommended yearly ophthalmology/optometry visit for glaucoma screening and checkup Recommended yearly dental visit for hygiene and checkup  Vaccinations: Influenza vaccine: declined  Pneumococcal vaccine: due 2nd dose  Tdap vaccine: declined  Shingles vaccine: declined     Advanced directives: will provide copies   Conditions/risks identified: none   Next appointment: none    Preventive Care 65 Years and Older, Female Preventive care refers to lifestyle choices and visits with your health care provider that can promote health and wellness. What does preventive care include? A yearly physical exam. This is also called an annual well check. Dental exams once or twice a year. Routine eye exams. Ask your health care provider how often you should have your eyes checked. Personal lifestyle choices, including: Daily care of your teeth and gums. Regular physical activity. Eating a healthy diet. Avoiding tobacco and drug use. Limiting alcohol use. Practicing safe sex. Taking low-dose aspirin every day. Taking vitamin and mineral supplements as recommended by your health care provider. What happens during an annual well check? The services and screenings done by your health care provider during your annual well check will depend on your age, overall health, lifestyle risk factors, and family history of disease. Counseling  Your health care provider may ask you questions about your: Alcohol use. Tobacco use. Drug use. Emotional well-being. Home and relationship well-being. Sexual activity. Eating habits. History of falls. Memory and  ability to understand (cognition). Work and work Astronomer. Reproductive health. Screening  You may have the following tests or measurements: Height, weight, and BMI. Blood pressure. Lipid and cholesterol levels. These may be checked every 5 years, or more frequently if you are over 83 years old. Skin check. Lung cancer screening. You may have this screening every year starting at age 10 if you have a 30-pack-year history of smoking and currently smoke or have quit within the past 15 years. Fecal occult blood test (FOBT) of the stool. You may have this test every year starting at age 46. Flexible sigmoidoscopy or colonoscopy. You may have a sigmoidoscopy every 5 years or a colonoscopy every 10 years starting at age 15. Hepatitis C blood test. Hepatitis B blood test. Sexually transmitted disease (STD) testing. Diabetes screening. This is done by checking your blood sugar (glucose) after you have not eaten for a while (fasting). You may have this done every 1-3 years. Bone density scan. This is done to screen for osteoporosis. You may have this done starting at age 45. Mammogram. This may be done every 1-2 years. Talk to your health care provider about how often you should have regular mammograms. Talk with your health care provider about your test results, treatment options, and if necessary, the need for more tests. Vaccines  Your health care provider may recommend certain vaccines, such as: Influenza vaccine. This is recommended every year. Tetanus, diphtheria, and acellular pertussis (Tdap, Td) vaccine. You may need a Td booster every 10 years. Zoster vaccine. You may need this after age 93. Pneumococcal 13-valent conjugate (PCV13) vaccine. One dose is recommended after age 45. Pneumococcal polysaccharide (PPSV23) vaccine. One dose is recommended after age 92. Talk to your health care provider about which screenings  and vaccines you need and how often you need them. This information is  not intended to replace advice given to you by your health care provider. Make sure you discuss any questions you have with your health care provider. Document Released: 12/28/2015 Document Revised: 08/20/2016 Document Reviewed: 10/02/2015 Elsevier Interactive Patient Education  2017 [place] Prevention in the Home Falls can cause injuries. They can happen to people of all ages. There are many things you can do to make your home safe and to help prevent falls. What can I do on the outside of my home? Regularly fix the edges of walkways and driveways and fix any cracks. Remove anything that might make you trip as you walk through a door, such as a raised step or threshold. Trim any bushes or trees on the path to your home. Use bright outdoor lighting. Clear any walking paths of anything that might make someone trip, such as rocks or tools. Regularly check to see if handrails are loose or broken. Make sure that both sides of any steps have handrails. Any raised decks and porches should have guardrails on the edges. Have any leaves, snow, or ice cleared regularly. Use sand or salt on walking paths during winter. Clean up any spills in your garage right away. This includes oil or grease spills. What can I do in the bathroom? Use night lights. Install grab bars by the toilet and in the tub and shower. Do not use towel bars as grab bars. Use non-skid mats or decals in the tub or shower. If you need to sit down in the shower, use a plastic, non-slip stool. Keep the floor dry. Clean up any water that spills on the floor as soon as it happens. Remove soap buildup in the tub or shower regularly. Attach bath mats securely with double-sided non-slip rug tape. Do not have throw rugs and other things on the floor that can make you trip. What can I do in the bedroom? Use night lights. Make sure that you have a light by your bed that is easy to reach. Do not use any sheets or blankets that  are too big for your bed. They should not hang down onto the floor. Have a firm chair that has side arms. You can use this for support while you get dressed. Do not have throw rugs and other things on the floor that can make you trip. What can I do in the kitchen? Clean up any spills right away. Avoid walking on wet floors. Keep items that you use a lot in easy-to-reach places. If you need to reach something above you, use a strong step stool that has a grab bar. Keep electrical cords out of the way. Do not use floor polish or wax that makes floors slippery. If you must use wax, use non-skid floor wax. Do not have throw rugs and other things on the floor that can make you trip. What can I do with my stairs? Do not leave any items on the stairs. Make sure that there are handrails on both sides of the stairs and use them. Fix handrails that are broken or loose. Make sure that handrails are as long as the stairways. Check any carpeting to make sure that it is firmly attached to the stairs. Fix any carpet that is loose or worn. Avoid having throw rugs at the top or bottom of the stairs. If you do have throw rugs, attach them to the floor with carpet tape.  Make sure that you have a light switch at the top of the stairs and the bottom of the stairs. If you do not have them, ask someone to add them for you. What else can I do to help prevent falls? Wear shoes that: Do not have high heels. Have rubber bottoms. Are comfortable and fit you well. Are closed at the toe. Do not wear sandals. If you use a stepladder: Make sure that it is fully opened. Do not climb a closed stepladder. Make sure that both sides of the stepladder are locked into place. Ask someone to hold it for you, if possible. Clearly mark and make sure that you can see: Any grab bars or handrails. First and last steps. Where the edge of each step is. Use tools that help you move around (mobility aids) if they are needed. These  include: Canes. Walkers. Scooters. Crutches. Turn on the lights when you go into a dark area. Replace any light bulbs as soon as they burn out. Set up your furniture so you have a clear path. Avoid moving your furniture around. If any of your floors are uneven, fix them. If there are any pets around you, be aware of where they are. Review your medicines with your doctor. Some medicines can make you feel dizzy. This can increase your chance of falling. Ask your doctor what other things that you can do to help prevent falls. This information is not intended to replace advice given to you by your health care provider. Make sure you discuss any questions you have with your health care provider. Document Released: 09/27/2009 Document Revised: 05/08/2016 Document Reviewed: 01/05/2015 Elsevier Interactive Patient Education  2017 [employer].

## 2022-03-27 ENCOUNTER — Ambulatory Visit: Payer: Medicare Other | Admitting: Neurology

## 2022-04-03 ENCOUNTER — Ambulatory Visit (INDEPENDENT_AMBULATORY_CARE_PROVIDER_SITE_OTHER): Payer: Medicare Other | Admitting: Neurology

## 2022-04-03 ENCOUNTER — Encounter: Payer: Self-pay | Admitting: Neurology

## 2022-04-03 VITALS — BP 146/81 | HR 77 | Ht 62.0 in | Wt <= 1120 oz

## 2022-04-03 DIAGNOSIS — F0280 Dementia in other diseases classified elsewhere without behavioral disturbance: Secondary | ICD-10-CM

## 2022-04-03 DIAGNOSIS — G301 Alzheimer's disease with late onset: Secondary | ICD-10-CM

## 2022-04-03 DIAGNOSIS — G40309 Generalized idiopathic epilepsy and epileptic syndromes, not intractable, without status epilepticus: Secondary | ICD-10-CM | POA: Diagnosis not present

## 2022-04-03 MED ORDER — PHENYTOIN SODIUM EXTENDED 100 MG PO CAPS: ORAL_CAPSULE | ORAL | 3 refills | Status: DC

## 2022-04-03 NOTE — Patient Instructions (Signed)
Good to see you. ? ? ?Continue Dilantin 300mg  every night ? ?2. Look into day programs with [facility] ? ?3. Continue all your medications ? ?4. Follow-up in 1 year, call for any changes ? ? ?Seizure Precautions: ?1. If medication has been prescribed for you to prevent seizures, take it exactly as directed.  Do not stop taking the medicine without talking to your doctor first, even if you have not had a seizure in a long time.  ? ?2. Avoid activities in which a seizure would cause danger to yourself or to others.  Don't operate dangerous machinery, swim alone, or climb in high or dangerous places, such as on ladders, roofs, or girders.  Do not drive unless your doctor says you may. ? ?3. If you have any warning that you may have a seizure, lay down in a safe place where you can't hurt yourself.   ? ?4.  No driving for 6 months from last seizure, as per [facility].   Please refer to the following link on the Epilepsy Foundation of America's website for more information: http://www.epilepsyfoundation.org/answerplace/Social/driving/drivingu.cfm  ? ?5.  Maintain good sleep hygiene. Avoid alcohol. ? ?6.  Contact your doctor if you have any problems that may be related to the medicine you are taking. ? ?7.  Call 911 and bring the patient back to the ED if: ?      ? A.  The seizure lasts longer than 5 minutes.      ? B.  The patient doesn't awaken shortly after the seizure ? C.  The patient has new problems such as difficulty seeing, speaking or moving ? D.  The patient was injured during the seizure ? E.  The patient has a temperature over 102 F (39C) ? F.  The patient vomited and now is having trouble breathing ?      ? ?FALL PRECAUTIONS: Be cautious when walking. Scan the area for obstacles that may increase the risk of trips and falls. When getting up in the mornings, sit up at the edge of the bed for a few minutes before getting out of bed. Consider elevating the bed at the head end to avoid drop of  blood pressure when getting up. Walk always in a well-lit room (use night lights in the walls). Avoid area rugs or power cords from appliances in the middle of the walkways. Use a walker or a cane if necessary and consider physical therapy for balance exercise. Get your eyesight checked regularly. ? ?HOME SAFETY: Consider the safety of the kitchen when operating appliances like stoves, microwave oven, and blender. Consider having supervision and share cooking responsibilities until no longer able to participate in those. Accidents with firearms and other hazards in the house should be identified and addressed as well. ? ?ABILITY TO BE LEFT ALONE: If patient is unable to contact 911 operator, consider using [facility], or when the need is there, arrange for someone to stay with patients. Smoking is a fire hazard, consider supervision or cessation. Risk of wandering should be assessed by caregiver and if detected at any point, supervision and safe proof recommendations should be instituted. ? ? ? ?RECOMMENDATIONS FOR ALL PATIENTS WITH MEMORY PROBLEMS: ?1. Continue to exercise (Recommend 30 minutes of walking everyday, or 3 hours every week) ?2. Increase social interactions - continue going to [place] and enjoy social gatherings with friends and family ?3. Eat healthy, avoid fried foods and eat more fruits and vegetables ?4. Maintain adequate blood pressure, blood  sugar, and blood cholesterol level. Reducing the risk of stroke and cardiovascular disease also helps promoting better memory. ?5. Avoid stressful situations. Live a simple life and avoid aggravations. Organize your time and prepare for the next day in anticipation. ?6. Sleep well, avoid any interruptions of sleep and avoid any distractions in the bedroom that may interfere with adequate sleep quality ?7. Avoid sugar, avoid sweets as there is a strong link between excessive sugar intake, diabetes, and cognitive impairment ?The Mediterranean diet has been shown  to help patients reduce the risk of progressive memory disorders and reduces cardiovascular risk. This includes eating fish, eat fruits and green leafy vegetables, nuts like almonds and hazelnuts, walnuts, and also use olive oil. Avoid fast foods and fried foods as much as possible. Avoid sweets and sugar as sugar use has been linked to worsening of memory function. ? ?There is always a concern of gradual progression of memory problems. If this is the case, then we may need to adjust level of care according to patient needs. Support, both to the patient and caregiver, should then be put into place. ? ?

## 2022-04-03 NOTE — Progress Notes (Signed)
? ?NEUROLOGY FOLLOW UP OFFICE NOTE ? ?Gabrielle Berry ?[id number] ?[birth year] ? ?HISTORY OF PRESENT ILLNESS: ?I had the pleasure of seeing Gabrielle Berry in follow-up in the neurology clinic on 04/03/2022.  The patient was last seen a year ago for seizures and dementia. She is again accompanied by her daughter [name] who helps supplement the history today.  Records and images were personally reviewed where available. They continue to deny any seizures since May 2021 on Dilantin 300mg  qhs. She had a fall in July and sustained a right wrist fracture, she has 24/7 supervision and they make sure she has her walker at all times. Family denies any staring/unresponsive episodes, myoclonic jerks. She denies any headaches, dizziness, focal numbness/tingling/weakness. Family manages her medications, meals, finances. She is able to bathe and dress herself. She does not drive. No hallucinations or paranoia, "sometimes mind drifts a little bit." Sleep and appetite are good. She is on Donepezil 5mg  daily. No side effects on medications.  ? ? ?History on Initial Assessment 08/27/2020: This is a pleasant 71 year old left-handed woman with a history of hypertension, dementia, epilepsy, recurrent Guillain-Barre syndrome, presenting to establish care. She moved to [place] to live with her daughter last May, after hospital admission for fever and seizure. She had been seizure-free since 1994 on Dilantin 300mg  qhs, until she had a seizure last May 2021 and was found to have non-existent Dilantin level. They found out she had not refilled it since January 2021. She had also stopped the Donepezil that time. She had been living alone in , with her nephew staying with her but not helping. She had a boyfriend she would visit daily, and denied getting lost driving to his home. He passed away recently. She was not missing bill payments, her daughter states she was not missing it because she knew she had to pay them, but when it came to taking  care of herself, this was a different issue. She was forgetting to eat and 'was living off peanut butter and jelly sandwiches. She feels her memory is okay, but has word-finding difficulties. She was diagnosed with dementia around 2 years ago and has been taking Donepezil 5mg  daily without side effects. She has a history of seizures since childhood with generalized tonic-clonic seizures that were well-controlled on Dilantin until last May. Her daughter denies any staring/unresponsive episodes. She denies any olfactory/gustatory hallucinations, myoclonic jerks. She has a history of 2 episodes of Guillain-Barre, the first occurred after a flu shot in 1997, she was intubated in [facility] and admitted for a month. She had another episode of GBS in 2000 when she started having constant itching in her feet, the diagnosis was confirmed and she received IV treatment and more physical therapy because she could not walk/use her hands or legs. She fractured her right leg at that time trying to ambulate. She has had chronic right leg pain since then. She cannot feel her feet and has paresthesias in her fingers. She denies any headaches. She gets dizzy walking sometimes. Her last fall was in April. She denies any diplopia, dysarthria/dysphagia, neck/back pain, bowel/bladder dysfunction, anosmia. She has tremors in both hands. Sleep is good. Since moving in with 1998, her daughter manages meals, finances, medications. She does not drive. She is independent with dressing and bathing. No personality changes. She had delirium in May and was hallucinating initially upon return to [facility], this has resolved. No family history of dementia. She does not drink alcohol.  ? ?Epilepsy Risk Factors:  There is a family history of seizures in her daughter (childhood seizures), granddaughter, and niece. She had febrile seizures in childhood. Otherwise she had a normal birth and early development.  There is no history of CNS infections  such as meningitis/encephalitis, significant traumatic brain injury, neurosurgical procedures. ? ?Prior AEDs: Keppra  ? ? ?PAST MEDICAL HISTORY: ?Past Medical History:  ?Diagnosis Date  ? Anxiety   ? Cataract   ? Guillain Barr? syndrome (HCC)   ? Guillain-Barre disease (HCC)   ? Seizures (HCC)   ? ? ?MEDICATIONS: ?Current Outpatient Medications on File Prior to Visit  ?Medication Sig Dispense Refill  ? alendronate (FOSAMAX) 70 MG tablet Take 1 tablet (70 mg total) by mouth every 7 (seven) days. Take with a full glass of water on an empty stomach. 4 tablet 11  ? calcium-vitamin D (OSCAL-500) 500-400 MG-UNIT tablet Take 1 tablet by mouth 2 (two) times daily. 180 tablet 1  ? donepezil (ARICEPT) 5 MG tablet Take 1 tablet (5 mg total) by mouth at bedtime. 90 tablet 3  ? HYDROcodone-acetaminophen (NORCO/VICODIN) 5-325 MG tablet TAKE 1 TABLET BY MOUTH NIGHTLY AS NEEDED 30 tablet 0  ? PARoxetine (PAXIL) 20 MG tablet TAKE 1 TABLET (20 MG TOTAL) BY MOUTH DAILY. 90 tablet 1  ? phenytoin (DILANTIN) 100 MG ER capsule Take 3 capsules every night 270 capsule 3  ? ?No current facility-administered medications on file prior to visit.  ? ? ?ALLERGIES: ?Allergies  ?Allergen Reactions  ? Penicillins   ? ? ?FAMILY HISTORY: ?Family History  ?Problem Relation Age of Onset  ? Hypertension Mother   ? Anxiety disorder Mother   ? Diabetes Father   ? ? ?SOCIAL HISTORY: ?Social History  ? ?Socioeconomic History  ? Marital status: Widowed  ?  Spouse name: Not on file  ? Number of children: Not on file  ? Years of education: Not on file  ? Highest education level: Not on file  ?Occupational History  ? Not on file  ?Tobacco Use  ? Smoking status: Never  ? Smokeless tobacco: Never  ?Vaping Use  ? Vaping Use: Never used  ?Substance and Sexual Activity  ? Alcohol use: Never  ? Drug use: Never  ? Sexual activity: Not on file  ?Other Topics Concern  ? Not on file  ?Social History Narrative  ? Left handed   ? Lives with daughter  ?   ? ?Social  Determinants of Health  ? ?Financial Resource Strain: Low Risk   ? Difficulty of Paying Living Expenses: Not hard at all  ?Food Insecurity: No Food Insecurity  ? Worried About Programme researcher, broadcasting/film/video in the Last Year: Never true  ? Ran Out of Food in the Last Year: Never true  ?Transportation Needs: No Transportation Needs  ? Lack of Transportation (Medical): No  ? Lack of Transportation (Non-Medical): No  ?Physical Activity: Inactive  ? Days of Exercise per Week: 0 days  ? Minutes of Exercise per Session: 0 min  ?Stress: No Stress Concern Present  ? Feeling of Stress : Not at all  ?Social Connections: Socially Isolated  ? Frequency of Communication with Friends and Family: Twice a week  ? Frequency of Social Gatherings with Friends and Family: Twice a week  ? Attends Religious Services: Never  ? Active Member of Clubs or Organizations: No  ? Attends Banker Meetings: Never  ? Marital Status: Widowed  ?Intimate Partner Violence: Not At Risk  ? Fear of Current or Ex-Partner: No  ?  Emotionally Abused: No  ? Physically Abused: No  ? Sexually Abused: No  ? ? ? ?PHYSICAL EXAM: ?Vitals:  ? 04/03/22 1123  ?BP: (!) 146/81  ?Pulse: 77  ?SpO2: 96%  ? ?General: No acute distress ?Head:  Normocephalic/atraumatic ?Skin/Extremities: No rash, no edema ?Neurological Exam: alert and oriented to person. No aphasia or dysarthria. Fund of knowledge is reduced.  Recent and remote memory are impaired. Attention and concentration are reduced. She started getting agitated with MMSE testing, not completed today. Cranial nerves: Pupils equal, round. Extraocular movements intact with no nystagmus. Visual fields full.  No facial asymmetry.  Motor: Bulk and tone normal, muscle strength 5/5 throughout with no pronator drift.   Finger to nose testing intact.  Gait slow and cautious with walker, no ataxia. ? ? ?IMPRESSION: ?This is a pleasant 71 yo LH woman with a history of hypertension, dementia, epilepsy, CIDP, who had been  seizure-free from 1994 until May 2021 due to medication non-compliance. She has been doing well since then with no seizures since May 2021 on Dilantin 300mg  qhs. Dementia overall stable, family is interested in joining day pro

## 2022-04-08 ENCOUNTER — Encounter: Payer: Self-pay | Admitting: Neurology

## 2022-04-10 DIAGNOSIS — Z0279 Encounter for issue of other medical certificate: Secondary | ICD-10-CM

## 2022-04-14 ENCOUNTER — Other Ambulatory Visit: Payer: Self-pay | Admitting: Family Medicine

## 2022-04-14 DIAGNOSIS — F419 Anxiety disorder, unspecified: Secondary | ICD-10-CM

## 2022-06-13 ENCOUNTER — Telehealth: Payer: Self-pay

## 2022-06-13 NOTE — Telephone Encounter (Signed)
Spoke with patient's daughter Gabrielle Berry about patient's overdue mammogram. Daughter states that patient no longer lives in [place].

## 2022-07-14 IMAGING — DX DG CHEST 1V PORT
2 series · 2 of 2 positions shown · non-contrast
Comparison: None.

CLINICAL DATA: Fall, altered mental status

EXAM:
PORTABLE CHEST 1 VIEW

[chest ap (1 of 2)]
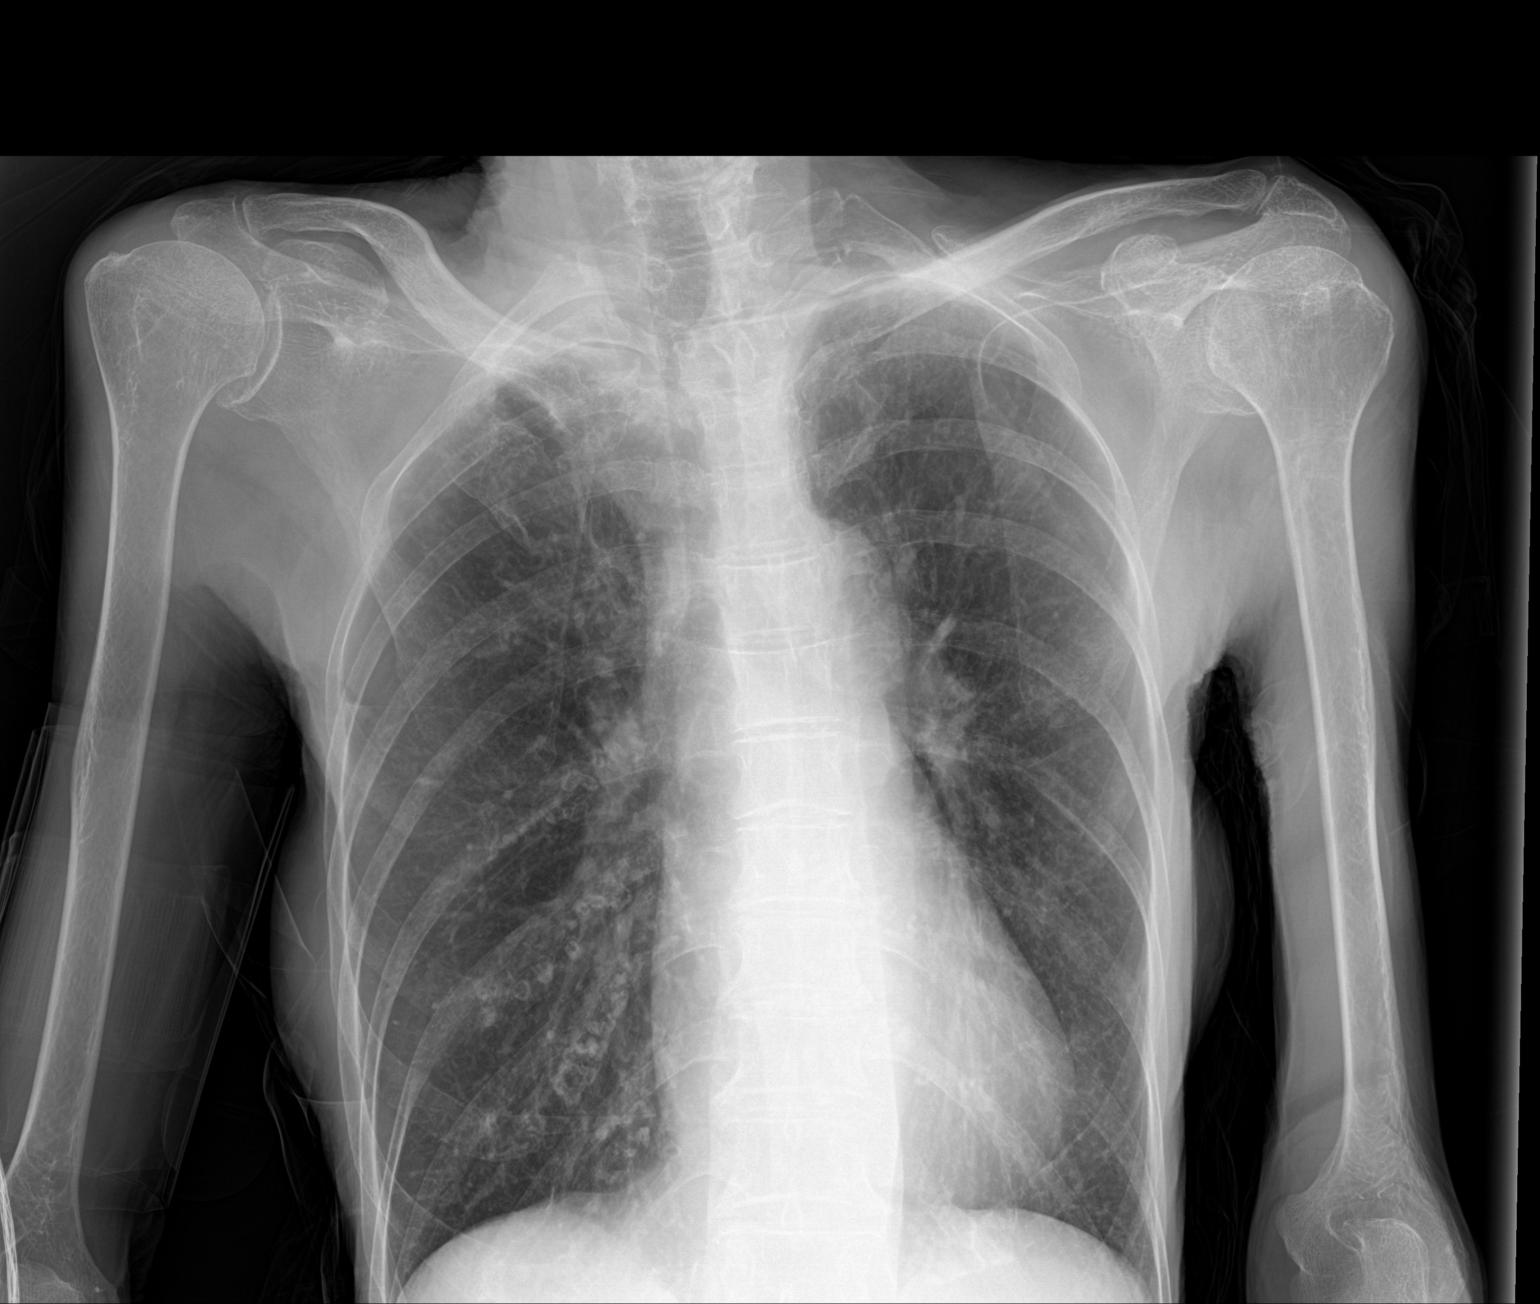

[chest ap (2 of 2)]
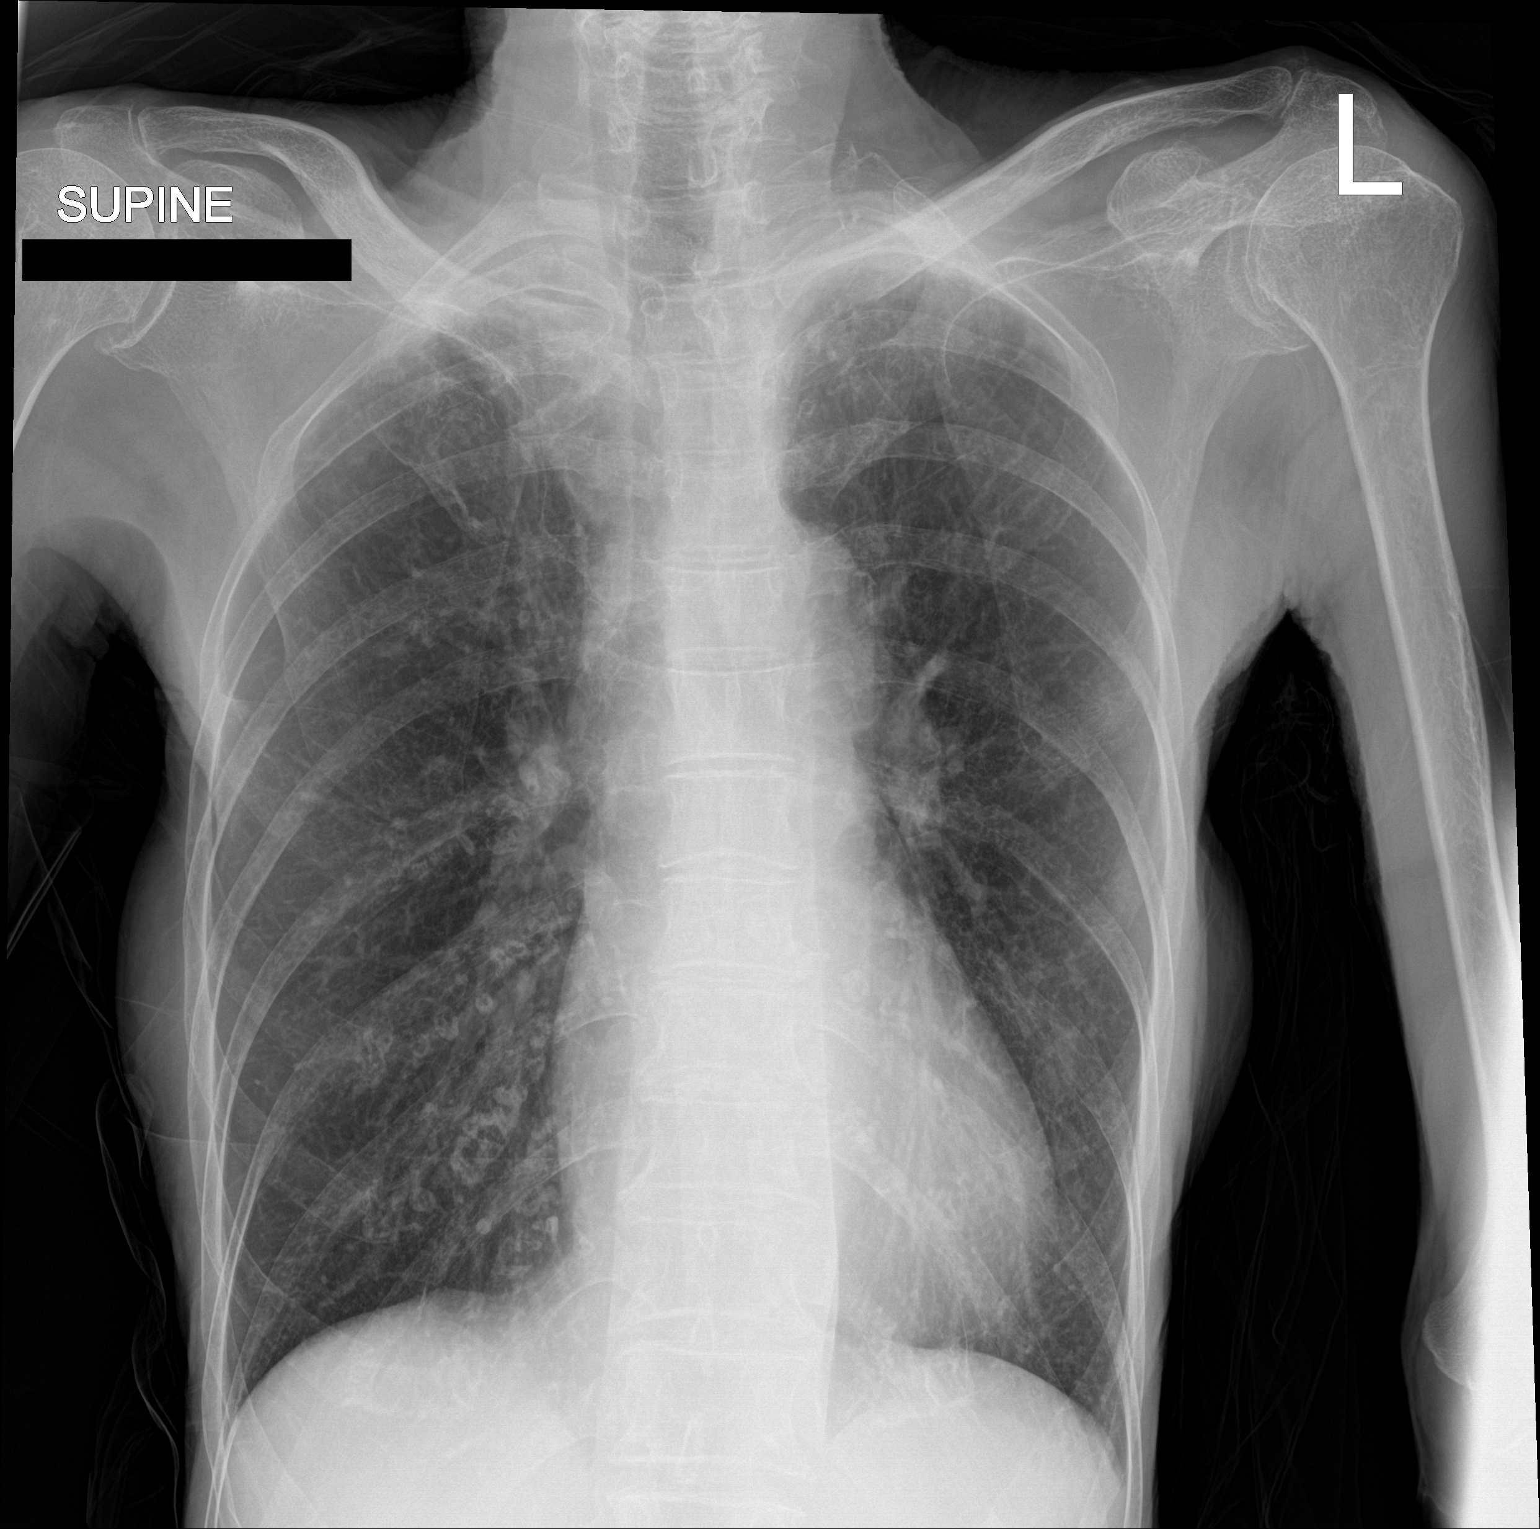

[2 of 2 positions shown; findings below may reference images not displayed]

FINDINGS: The lungs are symmetrically mildly hyperinflated suggesting changes
of underlying COPD. The lungs are clear. No pneumothorax or pleural
effusion. Cardiac size within normal limits. Pulmonary vascularity
is normal. No acute bone abnormality.
IMPRESSION: No active disease.  COPD.

## 2022-07-14 IMAGING — CT CT HEAD W/O CM
3 series · 15 of 47 positions shown, 18 images · non-contrast
Comparison: None.

CLINICAL DATA: Multiple falls.  Altered mental status.

EXAM:
CT HEAD WITHOUT CONTRAST
CT CERVICAL SPINE WITHOUT CONTRAST
TECHNIQUE: Multidetector CT imaging of the head and cervical spine was
performed following the standard protocol without intravenous
contrast. Multiplanar CT image reconstructions of the cervical spine
were also generated.

[Series 2: head 5.0 h30s · axial · 0.40mm/px · z∈[-266,-141]mm · 9 of 30 slices shown, 12 images]
[im 3/30  brain]
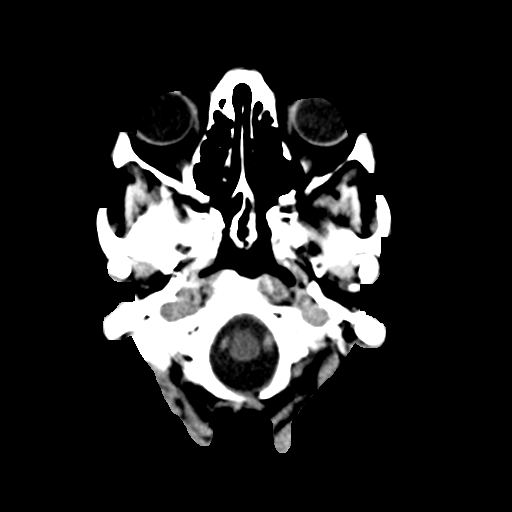
[im 3/30  bone]
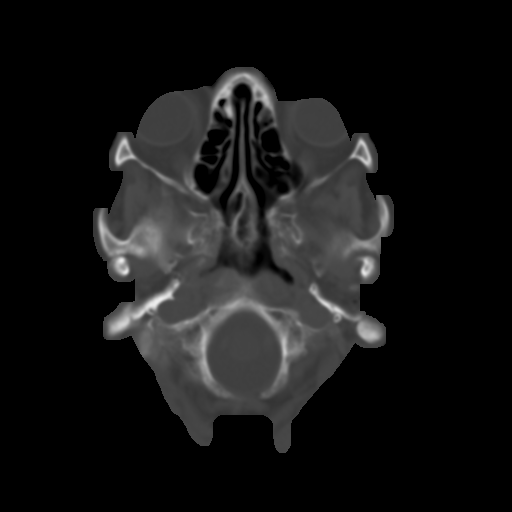
[im 6/30  brain]
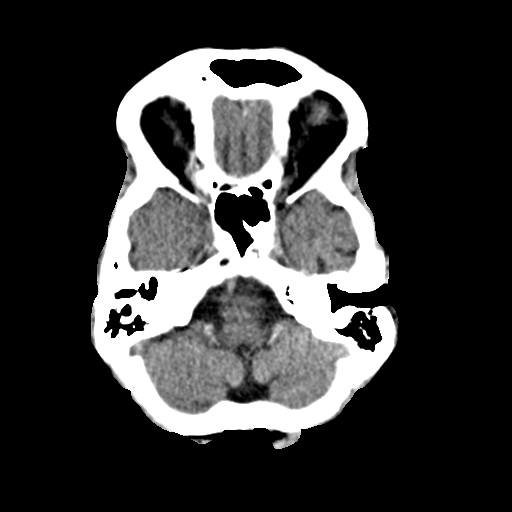
[im 9/30  brain]
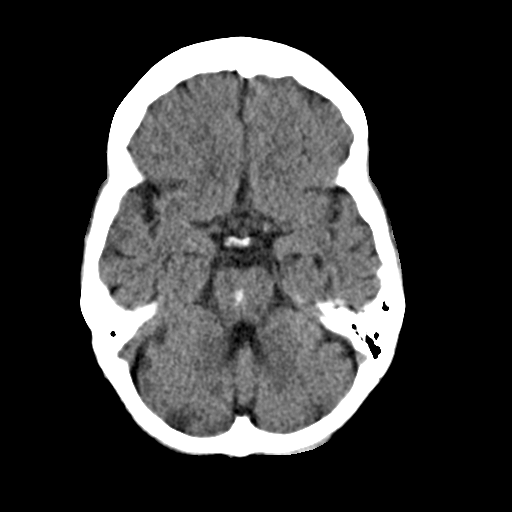
[im 12/30  brain]
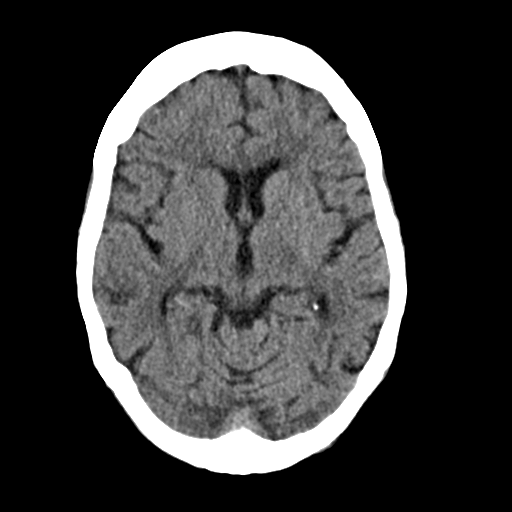
[im 16/30  brain]
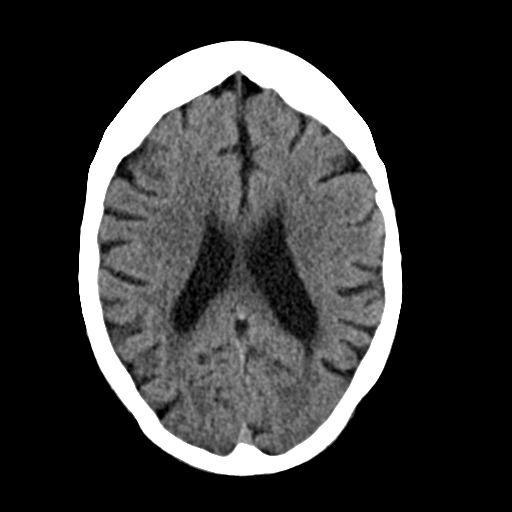
[im 16/30  bone]
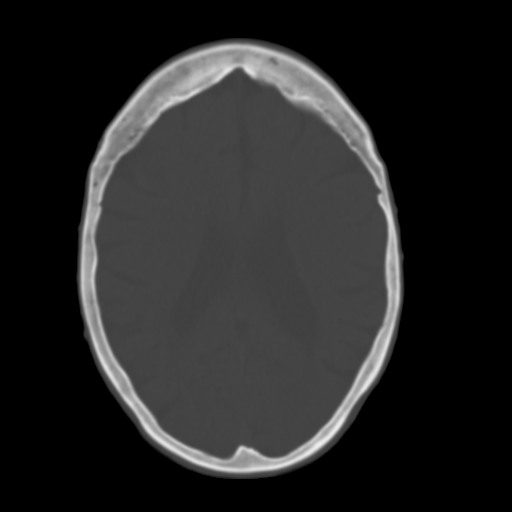
[im 19/30  brain]
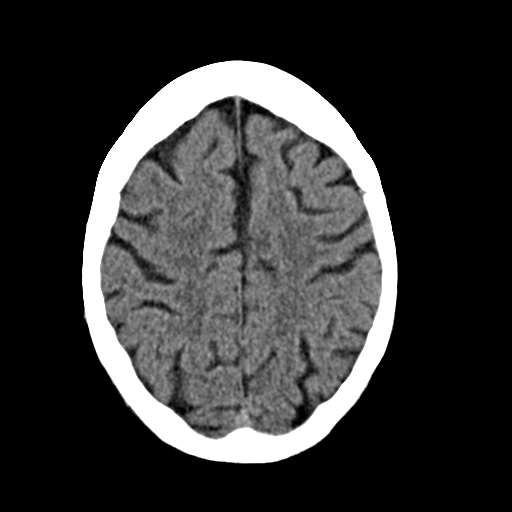
[im 22/30  brain]
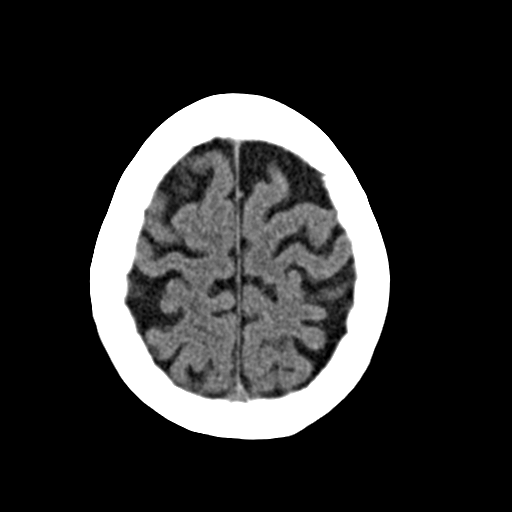
[im 25/30  brain]
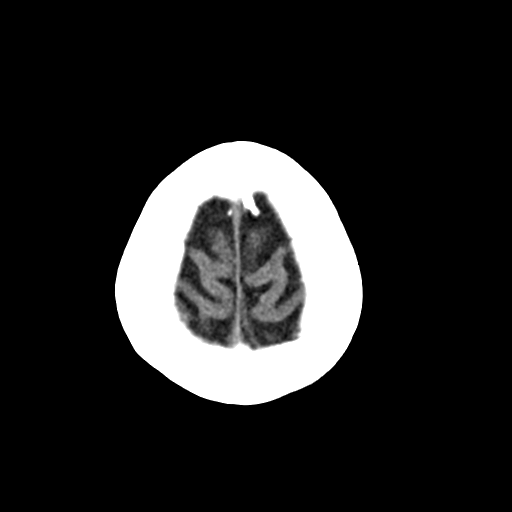
[im 28/30  brain]
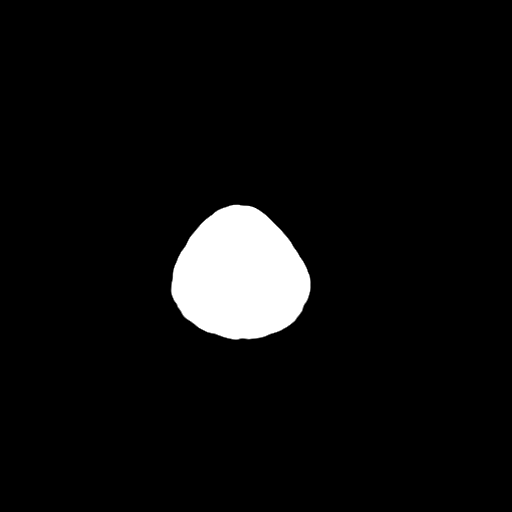
[im 28/30  bone]
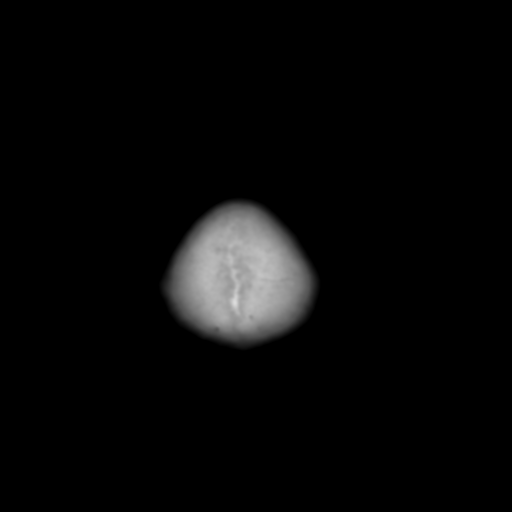

[Series 4: head 3.0 mpr cor · coronal · 0.31mm/px · 3 of 65 slices shown]
[im 22/65  brain]
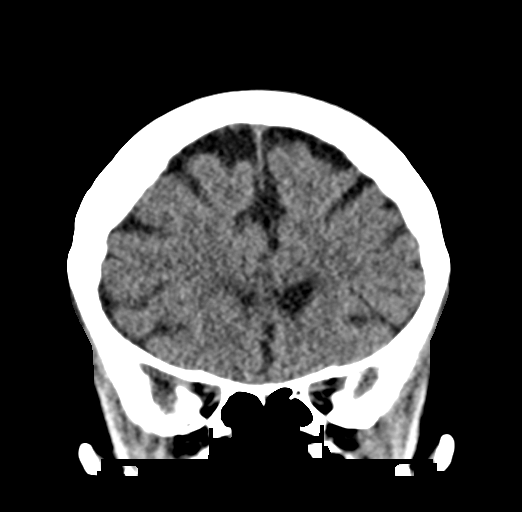
[im 29/65  brain]
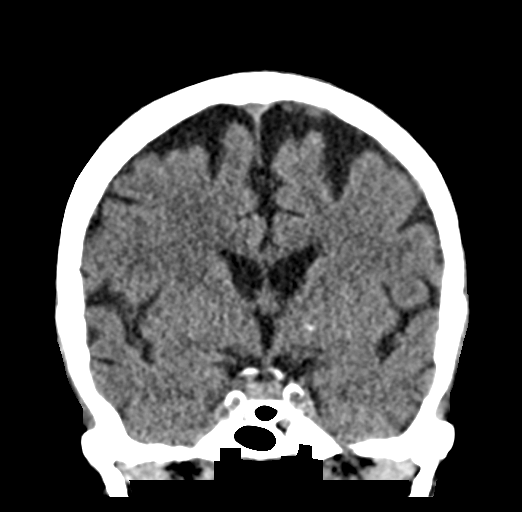
[im 36/65  brain]
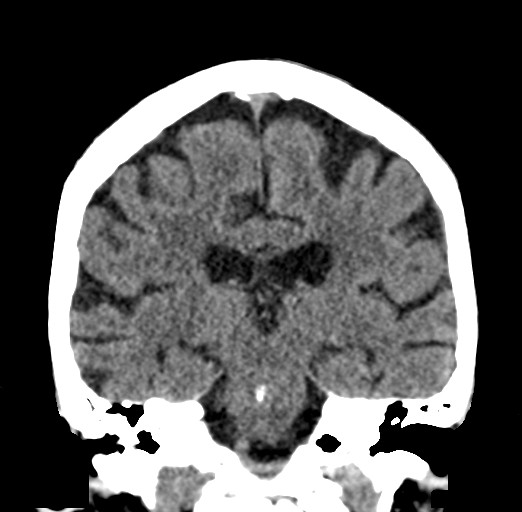

[Series 5: head 3.0 mpr sag · sagittal · 0.31mm/px · 3 of 50 slices shown]
[im 17/50  brain]
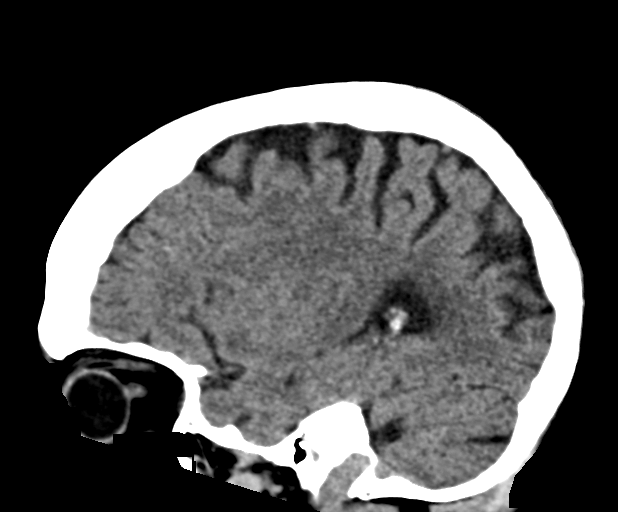
[im 25/50  brain]
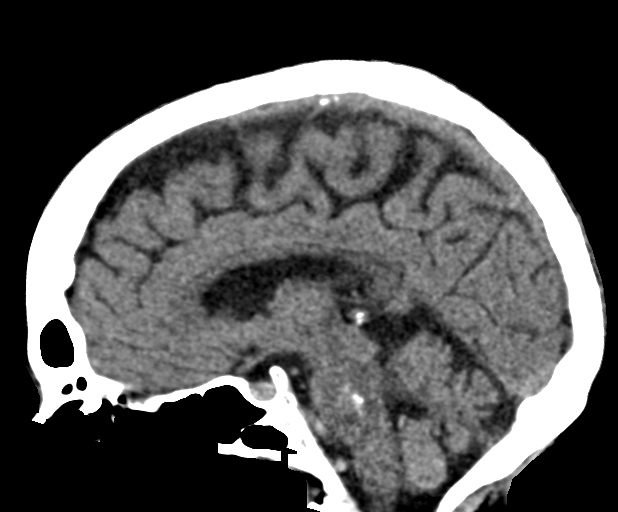
[im 33/50  brain]
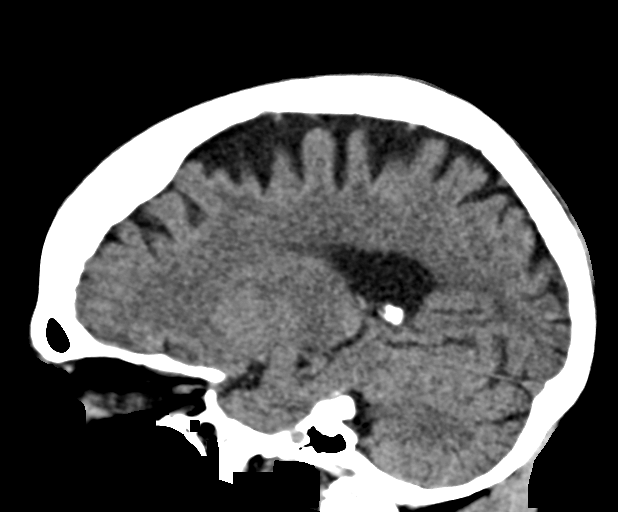

[15 of 47 positions shown; findings below may reference images not displayed]

FINDINGS: CT HEAD FINDINGS

Brain: Large calcification is seen in the central pons which most
likely represents chronic sequela of prior infarction or infection.
No mass effect or midline shift is noted. Ventricular size is within
normal limits. There is no evidence of mass lesion, hemorrhage or
acute infarction.

Vascular: No hyperdense vessel or unexpected calcification.

Skull: Normal. Negative for fracture or focal lesion.

Sinuses/Orbits: No acute finding.

Other: None.

CT CERVICAL SPINE FINDINGS

Alignment: Mild grade 1 anterolisthesis of C4-5 is noted secondary
to posterior facet joint hypertrophy.

Skull base and vertebrae: No acute fracture. No primary bone lesion
or focal pathologic process.

Soft tissues and spinal canal: No prevertebral fluid or swelling. No
visible canal hematoma.

Disc levels: Moderate degenerative disc disease is noted at C5-6,
C6-7 and C7-T1.

Upper chest: Negative.

Other: Degenerative changes are seen involving posterior facet
joints bilaterally.
IMPRESSION: 1. No acute intracranial abnormality seen.
2. Multilevel degenerative disc disease. No acute abnormality seen
in the cervical spine.

## 2022-08-04 ENCOUNTER — Ambulatory Visit (INDEPENDENT_AMBULATORY_CARE_PROVIDER_SITE_OTHER): Payer: Medicare Other | Admitting: Family Medicine

## 2022-08-04 ENCOUNTER — Encounter: Payer: Self-pay | Admitting: Family Medicine

## 2022-08-04 VITALS — BP 132/66 | HR 105 | Temp 97.6°F | Ht 62.0 in | Wt <= 1120 oz

## 2022-08-04 DIAGNOSIS — R5381 Other malaise: Secondary | ICD-10-CM

## 2022-08-04 DIAGNOSIS — F419 Anxiety disorder, unspecified: Secondary | ICD-10-CM | POA: Diagnosis not present

## 2022-08-04 DIAGNOSIS — R634 Abnormal weight loss: Secondary | ICD-10-CM | POA: Diagnosis not present

## 2022-08-04 DIAGNOSIS — F0280 Dementia in other diseases classified elsewhere without behavioral disturbance: Secondary | ICD-10-CM

## 2022-08-04 DIAGNOSIS — G309 Alzheimer's disease, unspecified: Secondary | ICD-10-CM

## 2022-08-04 MED ORDER — DONEPEZIL HCL 5 MG PO TBDP: 5.0000 mg | ORAL_TABLET | Freq: Every day | ORAL | 3 refills | Status: DC
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
## 2022-08-12 ENCOUNTER — Telehealth: Payer: Self-pay | Admitting: Family Medicine

## 2022-08-12 ENCOUNTER — Encounter: Payer: Self-pay | Admitting: Family Medicine

## 2022-08-12 NOTE — Telephone Encounter (Signed)
Please advise message below patients daughter would like a letter excusing her from [employer].

## 2022-08-12 NOTE — Telephone Encounter (Signed)
Caller Name: [name] Pts daughter Call back phone #: [phone]  Reason for Call: [name] has to be with her mother all the time. She has received a summons for [employer]. She needs a letter from Dr. Doreene Burke to be excused. She can access he mom's Mychart. Please send through there. She is on her DPR

## 2022-08-12 NOTE — Telephone Encounter (Signed)
Letter written tried calling to inform no answer unable to LM

## 2022-08-27 ENCOUNTER — Telehealth: Payer: Self-pay | Admitting: Family Medicine

## 2022-08-27 DIAGNOSIS — F419 Anxiety disorder, unspecified: Secondary | ICD-10-CM

## 2022-08-27 DIAGNOSIS — R634 Abnormal weight loss: Secondary | ICD-10-CM

## 2022-08-27 NOTE — Telephone Encounter (Signed)
Caller Name: [name] Call back phone #: [phone]   MEDICATION(S):  mirtazapine (REMERON) 7.5 MG tablet [[id number]]   Days of Med Remaining:   Has the patient contacted their pharmacy (YES/NO)? Yes , contact your PCP. Her daughter says they are aware Dr. Doreene Burke has never filled this.    Preferred Pharmacy: [facility] - [place], [place] - [address]  [address] [place] [address]  Phone:  [phone]  Fax:  [phone]

## 2022-08-28 MED ORDER — MIRTAZAPINE 7.5 MG PO TABS: 7.5000 mg | ORAL_TABLET | Freq: Every day | ORAL | 2 refills | Status: DC

## 2022-08-28 NOTE — Telephone Encounter (Signed)
Refill request for pending Rx last OV 08/04/22. Please advise

## 2022-09-01 ENCOUNTER — Telehealth: Payer: Self-pay | Admitting: Family Medicine

## 2022-09-01 MED ORDER — MIRTAZAPINE 7.5 MG PO TABS: 7.5000 mg | ORAL_TABLET | Freq: Every day | ORAL | 2 refills | Status: DC

## 2022-09-01 NOTE — Telephone Encounter (Signed)
Spoke with patients daughter who agrees to hold on Mirtazapine for tonight appointment scheduled for evaluation.

## 2022-09-01 NOTE — Telephone Encounter (Signed)
Pt's daughter, [name] is calling in for her mom. [name] feels her mom is having a reaction to taking mirtazapine (REMERON) 7.5 MG tablet [[id number]]. She is very tired and is having a lot of diarrhea. I transferred her over to nurse triage.

## 2022-09-01 NOTE — Addendum Note (Signed)
Addended by: Abelino Derrick A on: 09/01/2022 11:56 AM   Modules accepted: Orders

## 2022-09-02 ENCOUNTER — Encounter: Payer: Self-pay | Admitting: Family Medicine

## 2022-09-02 ENCOUNTER — Ambulatory Visit (INDEPENDENT_AMBULATORY_CARE_PROVIDER_SITE_OTHER): Payer: Medicare Other | Admitting: Family Medicine

## 2022-09-02 VITALS — BP 144/72 | HR 51 | Temp 97.4°F | Ht 62.0 in | Wt 70.8 lb

## 2022-09-02 DIAGNOSIS — R634 Abnormal weight loss: Secondary | ICD-10-CM

## 2022-09-02 DIAGNOSIS — R030 Elevated blood-pressure reading, without diagnosis of hypertension: Secondary | ICD-10-CM

## 2022-09-02 DIAGNOSIS — R5381 Other malaise: Secondary | ICD-10-CM | POA: Diagnosis not present

## 2022-09-02 DIAGNOSIS — T887XXA Unspecified adverse effect of drug or medicament, initial encounter: Secondary | ICD-10-CM | POA: Diagnosis not present

## 2022-09-02 LAB — COMPREHENSIVE METABOLIC PANEL
ALT: 9 U/L (ref 0–35)
AST: 18 U/L (ref 0–37)
Albumin: 3.3 g/dL — ABNORMAL LOW (ref 3.5–5.2)
Alkaline Phosphatase: 136 U/L — ABNORMAL HIGH (ref 39–117)
BUN: 13 mg/dL (ref 6–23)
CO2: 30 mEq/L (ref 19–32)
Calcium: 9.3 mg/dL (ref 8.4–10.5)
Chloride: 101 mEq/L (ref 96–112)
Creatinine, Ser: 0.53 mg/dL (ref 0.40–1.20)
GFR: 91.74 mL/min (ref >60.00)
Glucose, Bld: 81 mg/dL (ref 70–99)
Potassium: 4.9 mEq/L (ref 3.5–5.1)
Sodium: 136 mEq/L (ref 135–145)
Total Bilirubin: 0.2 mg/dL (ref 0.2–1.2)
Total Protein: 8.6 g/dL — ABNORMAL HIGH (ref 6.0–8.3)

## 2022-09-02 LAB — CBC
HCT: 36.4 % (ref 36.0–46.0)
Hemoglobin: 12.1 g/dL (ref 12.0–15.0)
MCHC: 33.1 g/dL (ref 30.0–36.0)
MCV: 98 fl (ref 78.0–100.0)
Platelets: 326 10*3/uL (ref 150.0–400.0)
RBC: 3.72 Mil/uL — ABNORMAL LOW (ref 3.87–5.11)
RDW: 15.2 % (ref 11.5–15.5)
WBC: 5.7 10*3/uL (ref 4.0–10.5)

## 2022-09-02 NOTE — Progress Notes (Signed)
Established Patient Office Visit  Subjective   Patient ID: Gabrielle Berry, female    DOB: 1951-03-06  Age: 71 y.o. MRN: [id number]  Chief Complaint  Patient presents with  . Follow-up    Possible reaction to Remeron patient seems to be sleeping more than normal with lots of diarrhea.     HPI follow-up of anxiety seems to be improving after she is back in with her daughter.  Simply did not tolerate mirtazapine.  She took it for a month and had to deal with daytime sleepiness.  Also seems to have led to some diarrhea.  Has been discontinued and the symptoms seem to have resolved.  Continues Paxil without issue.  Continues to use the wheelchair for long distances.  Has been able to gain some weight with Ensure supplementation.    Review of Systems  Constitutional: Negative.   HENT: Negative.    Eyes:  Negative for blurred vision, discharge and redness.  Respiratory: Negative.    Cardiovascular: Negative.   Gastrointestinal:  Negative for abdominal pain.  Genitourinary: Negative.   Musculoskeletal: Negative.  Negative for myalgias.  Skin:  Negative for rash.  Neurological:  Negative for tingling, loss of consciousness and weakness.  Endo/Heme/Allergies:  Negative for polydipsia.      09/02/2022   10:25 AM 08/04/2022   10:21 AM 11/12/2021   11:28 AM  Depression screen PHQ 2/9  Decreased Interest 0 0 0  Down, Depressed, Hopeless 0 0 0  PHQ - 2 Score 0 0 0       Objective:     BP (!) 144/72 (BP Location: Left Arm, Patient Position: Sitting, Cuff Size: Small)   Pulse (!) 51   Temp (!) 97.4 F (36.3 C) (Temporal)   Ht 5\' 2"  (1.575 m)   Wt 70 lb 12.8 oz (32.1 kg)   LMP  (LMP Unknown)   SpO2 99%   BMI 12.95 kg/m  BP Readings from Last 3 Encounters:  09/02/22 (!) 144/72  08/04/22 132/66  04/03/22 (!) 146/81   Wt Readings from Last 3 Encounters:  09/02/22 70 lb 12.8 oz (32.1 kg)  08/04/22 67 lb (30.4 kg)  04/03/22 69 lb (31.3 kg)      Physical Exam Constitutional:       General: She is not in acute distress.    Appearance: Normal appearance. She is not ill-appearing, toxic-appearing or diaphoretic.  HENT:     Head: Normocephalic and atraumatic.     Right Ear: External ear normal.     Left Ear: External ear normal.     Mouth/Throat:     Mouth: Mucous membranes are dry.     Pharynx: Oropharynx is clear.  Eyes:     General: No scleral icterus.       Right eye: No discharge.        Left eye: No discharge.     Extraocular Movements: Extraocular movements intact.     Conjunctiva/sclera: Conjunctivae normal.  Cardiovascular:     Rate and Rhythm: Normal rate and regular rhythm.  Pulmonary:     Effort: Pulmonary effort is normal. No respiratory distress.     Breath sounds: Normal breath sounds.  Skin:    General: Skin is warm and dry.  Neurological:     Mental Status: She is alert and oriented to person, place, and time.  Psychiatric:        Mood and Affect: Mood normal.        Behavior: Behavior normal.  No results found for any visits on 09/02/22.    The 10-year ASCVD risk score (Arnett DK, et al., 2019) is: 16.1%    Assessment & Plan:   Problem List Items Addressed This Visit       Other   Weight loss - Primary   Relevant Orders   Comprehensive metabolic panel   Prealbumin   Debilitated   Relevant Orders   Ambulatory referral to Physical Therapy   Medication side effect   Elevated BP without diagnosis of hypertension   Relevant Orders   CBC   Comprehensive metabolic panel    Return in about 3 months (around 12/02/2022), or if symptoms worsen or fail to improve.  Asked daughter to check blood pressures and record.  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  Advised COVID-vaccine.  Encouraged adequate hydration.  Libby Maw, MD

## 2022-09-03 LAB — PREALBUMIN: Prealbumin: 17 mg/dL (ref 17–34)

## 2022-09-08 ENCOUNTER — Encounter: Payer: Self-pay | Admitting: Family Medicine

## 2022-09-09 ENCOUNTER — Ambulatory Visit: Payer: Medicare Other

## 2022-09-15 ENCOUNTER — Ambulatory Visit: Payer: Medicare Other | Admitting: Family Medicine

## 2022-10-17 ENCOUNTER — Other Ambulatory Visit: Payer: Self-pay | Admitting: Family Medicine

## 2022-10-17 DIAGNOSIS — F419 Anxiety disorder, unspecified: Secondary | ICD-10-CM

## 2022-11-13 ENCOUNTER — Telehealth: Payer: Self-pay | Admitting: Family Medicine

## 2022-11-13 NOTE — Telephone Encounter (Signed)
Left message for patient to call back and schedule Medicare Annual Wellness Visit (AWV) in office.   If not able to come in office, please offer to do virtually or by telephone.  Left office number and my jabber #[phone].  Last AWV:11/12/2021   Please schedule at anytime with Nurse Health Advisor.  

## 2022-11-18 ENCOUNTER — Telehealth: Payer: Self-pay | Admitting: Family Medicine

## 2022-11-18 DIAGNOSIS — F0280 Dementia in other diseases classified elsewhere without behavioral disturbance: Secondary | ICD-10-CM

## 2022-11-18 MED ORDER — DONEPEZIL HCL 10 MG PO TBDP: 10.0000 mg | ORAL_TABLET | Freq: Every day | ORAL | 1 refills | Status: DC

## 2022-11-18 NOTE — Telephone Encounter (Signed)
Caller Name: [name]  Call back phone #: [phone]  Reason for Call: Called on behalf of pt. She is asking for Donepezil to be increased from 5mg  to 10mg .

## 2022-11-18 NOTE — Telephone Encounter (Signed)
Please advise message below  °

## 2022-11-18 NOTE — Telephone Encounter (Signed)
Patient and daughter aware Rx increased and sent to pharmacy.

## 2022-11-25 ENCOUNTER — Ambulatory Visit (INDEPENDENT_AMBULATORY_CARE_PROVIDER_SITE_OTHER): Payer: Medicare Other

## 2022-11-25 VITALS — Ht 62.0 in | Wt <= 1120 oz

## 2022-11-25 DIAGNOSIS — Z0000 Encounter for general adult medical examination without abnormal findings: Secondary | ICD-10-CM | POA: Diagnosis not present

## 2022-11-25 NOTE — Patient Instructions (Signed)
[name] , Thank you for taking time to come for your Medicare Wellness Visit. I appreciate your ongoing commitment to your health goals. Please review the following plan we discussed and let me know if I can assist you in the future.   These are the goals we discussed:  Goals      Patient Stated     Maintain or improve current health     Patient Stated     11/25/2022, stay healthy        This is a list of the screening recommended for you and due dates:  Health Maintenance  Topic Date Due   Hepatitis C Screening: USPSTF Recommendation to screen - Ages 16-79 yo.  Never done   DTaP/Tdap/Td vaccine (1 - Tdap) Never done   Mammogram  Never done   DEXA scan (bone density measurement)  Never done   Medicare Annual Wellness Visit  11/26/2023   HPV Vaccine  Aged Out   Pneumonia Vaccine  Discontinued   Flu Shot  Discontinued   Colon Cancer Screening  Discontinued   COVID-19 Vaccine  Discontinued   Zoster (Shingles) Vaccine  Discontinued    Advanced directives: Please bring a copy of your POA (Power of Attorney) and/or Living Will to your next appointment.   Conditions/risks identified: none  Next appointment: Follow up in one year for your annual wellness visit    Preventive Care 65 Years and Older, Female Preventive care refers to lifestyle choices and visits with your health care provider that can promote health and wellness. What does preventive care include? A yearly physical exam. This is also called an annual well check. Dental exams once or twice a year. Routine eye exams. Ask your health care provider how often you should have your eyes checked. Personal lifestyle choices, including: Daily care of your teeth and gums. Regular physical activity. Eating a healthy diet. Avoiding tobacco and drug use. Limiting alcohol use. Practicing safe sex. Taking low-dose aspirin every day. Taking vitamin and mineral supplements as recommended by your health care provider. What  happens during an annual well check? The services and screenings done by your health care provider during your annual well check will depend on your age, overall health, lifestyle risk factors, and family history of disease. Counseling  Your health care provider may ask you questions about your: Alcohol use. Tobacco use. Drug use. Emotional well-being. Home and relationship well-being. Sexual activity. Eating habits. History of falls. Memory and ability to understand (cognition). Work and work Astronomer. Reproductive health. Screening  You may have the following tests or measurements: Height, weight, and BMI. Blood pressure. Lipid and cholesterol levels. These may be checked every 5 years, or more frequently if you are over 37 years old. Skin check. Lung cancer screening. You may have this screening every year starting at age 64 if you have a 30-pack-year history of smoking and currently smoke or have quit within the past 15 years. Fecal occult blood test (FOBT) of the stool. You may have this test every year starting at age 41. Flexible sigmoidoscopy or colonoscopy. You may have a sigmoidoscopy every 5 years or a colonoscopy every 10 years starting at age 52. Hepatitis C blood test. Hepatitis B blood test. Sexually transmitted disease (STD) testing. Diabetes screening. This is done by checking your blood sugar (glucose) after you have not eaten for a while (fasting). You may have this done every 1-3 years. Bone density scan. This is done to screen for osteoporosis. You may have  this done starting at age 25. Mammogram. This may be done every 1-2 years. Talk to your health care provider about how often you should have regular mammograms. Talk with your health care provider about your test results, treatment options, and if necessary, the need for more tests. Vaccines  Your health care provider may recommend certain vaccines, such as: Influenza vaccine. This is recommended every  year. Tetanus, diphtheria, and acellular pertussis (Tdap, Td) vaccine. You may need a Td booster every 10 years. Zoster vaccine. You may need this after age 58. Pneumococcal 13-valent conjugate (PCV13) vaccine. One dose is recommended after age 38. Pneumococcal polysaccharide (PPSV23) vaccine. One dose is recommended after age 24. Talk to your health care provider about which screenings and vaccines you need and how often you need them. This information is not intended to replace advice given to you by your health care provider. Make sure you discuss any questions you have with your health care provider. Document Released: 12/28/2015 Document Revised: 08/20/2016 Document Reviewed: 10/02/2015 Elsevier Interactive Patient Education  2017 [place] Prevention in the Home Falls can cause injuries. They can happen to people of all ages. There are many things you can do to make your home safe and to help prevent falls. What can I do on the outside of my home? Regularly fix the edges of walkways and driveways and fix any cracks. Remove anything that might make you trip as you walk through a door, such as a raised step or threshold. Trim any bushes or trees on the path to your home. Use bright outdoor lighting. Clear any walking paths of anything that might make someone trip, such as rocks or tools. Regularly check to see if handrails are loose or broken. Make sure that both sides of any steps have handrails. Any raised decks and porches should have guardrails on the edges. Have any leaves, snow, or ice cleared regularly. Use sand or salt on walking paths during winter. Clean up any spills in your garage right away. This includes oil or grease spills. What can I do in the bathroom? Use night lights. Install grab bars by the toilet and in the tub and shower. Do not use towel bars as grab bars. Use non-skid mats or decals in the tub or shower. If you need to sit down in the shower, use a  plastic, non-slip stool. Keep the floor dry. Clean up any water that spills on the floor as soon as it happens. Remove soap buildup in the tub or shower regularly. Attach bath mats securely with double-sided non-slip rug tape. Do not have throw rugs and other things on the floor that can make you trip. What can I do in the bedroom? Use night lights. Make sure that you have a light by your bed that is easy to reach. Do not use any sheets or blankets that are too big for your bed. They should not hang down onto the floor. Have a firm chair that has side arms. You can use this for support while you get dressed. Do not have throw rugs and other things on the floor that can make you trip. What can I do in the kitchen? Clean up any spills right away. Avoid walking on wet floors. Keep items that you use a lot in easy-to-reach places. If you need to reach something above you, use a strong step stool that has a grab bar. Keep electrical cords out of the way. Do not use floor polish or  wax that makes floors slippery. If you must use wax, use non-skid floor wax. Do not have throw rugs and other things on the floor that can make you trip. What can I do with my stairs? Do not leave any items on the stairs. Make sure that there are handrails on both sides of the stairs and use them. Fix handrails that are broken or loose. Make sure that handrails are as long as the stairways. Check any carpeting to make sure that it is firmly attached to the stairs. Fix any carpet that is loose or worn. Avoid having throw rugs at the top or bottom of the stairs. If you do have throw rugs, attach them to the floor with carpet tape. Make sure that you have a light switch at the top of the stairs and the bottom of the stairs. If you do not have them, ask someone to add them for you. What else can I do to help prevent falls? Wear shoes that: Do not have high heels. Have rubber bottoms. Are comfortable and fit you  well. Are closed at the toe. Do not wear sandals. If you use a stepladder: Make sure that it is fully opened. Do not climb a closed stepladder. Make sure that both sides of the stepladder are locked into place. Ask someone to hold it for you, if possible. Clearly mark and make sure that you can see: Any grab bars or handrails. First and last steps. Where the edge of each step is. Use tools that help you move around (mobility aids) if they are needed. These include: Canes. Walkers. Scooters. Crutches. Turn on the lights when you go into a dark area. Replace any light bulbs as soon as they burn out. Set up your furniture so you have a clear path. Avoid moving your furniture around. If any of your floors are uneven, fix them. If there are any pets around you, be aware of where they are. Review your medicines with your doctor. Some medicines can make you feel dizzy. This can increase your chance of falling. Ask your doctor what other things that you can do to help prevent falls. This information is not intended to replace advice given to you by your health care provider. Make sure you discuss any questions you have with your health care provider. Document Released: 09/27/2009 Document Revised: 05/08/2016 Document Reviewed: 01/05/2015 Elsevier Interactive Patient Education  2017 [employer].

## 2022-11-25 NOTE — Progress Notes (Signed)
I connected with Gabrielle Berry today by telephone and verified that I am speaking with the correct person using two identifiers. Location patient: home Location provider: work Persons participating in the virtual visit: [place], [place] (daughter), Elisha Ponder LPN.   I discussed the limitations, risks, security and privacy concerns of performing an evaluation and management service by telephone and the availability of in person appointments. I also discussed with the patient that there may be a patient responsible charge related to this service. The patient expressed understanding and verbally consented to this telephonic visit.    Interactive audio and video telecommunications were attempted between this provider and patient, however failed, due to patient having technical difficulties OR patient did not have access to video capability.  We continued and completed visit with audio only.     Vital signs may be patient reported or missing.  Subjective:   Gabrielle Berry is a 71 y.o. female who presents for Medicare Annual (Subsequent) preventive examination.  Review of Systems     Cardiac Risk Factors include: advanced age (>81men, >61 women)     Objective:    Today's Vitals   11/25/22 1041  Weight: 70 lb (31.8 kg)  Height: 5\' 2"  (1.575 m)   Body mass index is 12.8 kg/m.     11/25/2022   10:45 AM 04/03/2022   11:24 AM 11/12/2021   11:21 AM 06/22/2021    1:06 PM 03/27/2021    9:27 AM 01/14/2021   12:01 PM 10/02/2020   12:44 PM  Advanced Directives  Does Patient Have a Medical Advance Directive? Yes Yes Yes Yes Yes Yes No  Type of 10/04/2020 of Sales promotion account executive Power of [place];Living will  Healthcare Power of Attorney    Does patient want to make changes to medical advance directive?       No - Patient declined  Copy of Healthcare Power of Attorney in Chart? No - copy requested  No - copy requested        Current  Medications (verified) Outpatient Encounter Medications as of 11/25/2022  Medication Sig   calcium-vitamin D (OSCAL-500) 500-400 MG-UNIT tablet Take 1 tablet by mouth 2 (two) times daily.   donepezil (ARICEPT ODT) 10 MG disintegrating tablet Take 1 tablet (10 mg total) by mouth at bedtime.   Multiple Vitamin (MULTIVITAMIN ADULT PO) Take 1 tablet by mouth daily.   PARoxetine (PAXIL) 20 MG tablet TAKE 1 TABLET (20 MG TOTAL) BY MOUTH DAILY.   phenytoin (DILANTIN) 100 MG ER capsule Take 3 capsules every night   No facility-administered encounter medications on file as of 11/25/2022.    Allergies (verified) Haemophilus influenzae vaccines and Penicillins   History: Past Medical History:  Diagnosis Date   Anxiety    Cataract    Guillain Barr syndrome (HCC)    Guillain-Barre disease (HCC)    Seizures (HCC)    Past Surgical History:  Procedure Laterality Date   EYE SURGERY     HIP SURGERY Right    TUBAL LIGATION     Family History  Problem Relation Age of Onset   Hypertension Mother    Anxiety disorder Mother    Diabetes Father    Social History   Socioeconomic History   Marital status: Widowed    Spouse name: Not on file   Number of children: Not on file   Years of education: Not on file   Highest education level: Not on file  Occupational History   Not  on file  Tobacco Use   Smoking status: Never   Smokeless tobacco: Never  Vaping Use   Vaping Use: Never used  Substance and Sexual Activity   Alcohol use: Never   Drug use: Never   Sexual activity: Not on file  Other Topics Concern   Not on file  Social History Narrative   Left handed    Lives with daughter      Social Determinants of Health   Financial Resource Strain: Low Risk  (11/25/2022)   Overall Financial Resource Strain (CARDIA)    Difficulty of Paying Living Expenses: Not hard at all  Food Insecurity: No Food Insecurity (11/25/2022)   Hunger Vital Sign    Worried About Running Out of Food in the  Last Year: Never true    Ran Out of Food in the Last Year: Never true  Transportation Needs: No Transportation Needs (11/25/2022)   PRAPARE - Administrator, Civil Service (Medical): No    Lack of Transportation (Non-Medical): No  Physical Activity: Inactive (11/25/2022)   Exercise Vital Sign    Days of Exercise per Week: 0 days    Minutes of Exercise per Session: 0 min  Stress: No Stress Concern Present (11/25/2022)   [employer] of Occupational Health - Occupational Stress Questionnaire    Feeling of Stress : Not at all  Social Connections: Socially Isolated (11/12/2021)   Social Connection and Isolation Panel [NHANES]    Frequency of Communication with Friends and Family: Twice a week    Frequency of Social Gatherings with Friends and Family: Twice a week    Attends Religious Services: Never    Database administrator or Organizations: No    Attends Banker Meetings: Never    Marital Status: Widowed    Tobacco Counseling Counseling given: Not Answered   Clinical Intake:  Pre-visit preparation completed: Yes  Pain : No/denies pain     Nutritional Status: BMI <19  Underweight Nutritional Risks: None Diabetes: No  How often do you need to have someone help you when you read instructions, pamphlets, or other written materials from your doctor or pharmacy?: 5 - Always  Diabetic? no  Interpreter Needed?: No  Information entered by :: NAllen LPN   Activities of Daily Living    11/25/2022   10:46 AM  In your present state of health, do you have any difficulty performing the following activities:  Hearing? 0  Vision? 0  Difficulty concentrating or making decisions? 1  Walking or climbing stairs? 1  Dressing or bathing? 0  Doing errands, shopping? 1  Preparing Food and eating ? Y  Using the Toilet? N  In the past six months, have you accidently leaked urine? Y  Do you have problems with loss of bowel control? Y  Managing your  Medications? Y  Managing your Finances? Y  Housekeeping or managing your Housekeeping? Y    Patient Care Team: [name], MD as PCP - General (Family Medicine) Van Clines, MD as Consulting Physician (Neurology)  Indicate any recent Medical Services you may have received from other than [facility] providers in the past year (date may be approximate).     Assessment:   This is a routine wellness examination for [place].  Hearing/Vision screen Vision Screening - Comments:: No regular eye exams,  Dietary issues and exercise activities discussed: Current Exercise Habits: The patient does not participate in regular exercise at present   Goals Addressed  This Visit's Progress    Patient Stated       11/25/2022, stay healthy       Depression Screen    11/25/2022   10:46 AM 09/02/2022   10:25 AM 08/04/2022   10:21 AM 11/12/2021   11:28 AM 11/12/2021   11:20 AM 04/15/2021   10:18 AM 10/02/2020   12:46 PM  PHQ 2/9 Scores  PHQ - 2 Score 0 0 0 0 0 0 0    Fall Risk    11/25/2022   10:46 AM 09/02/2022   10:25 AM 08/04/2022   10:20 AM 04/03/2022   11:24 AM 11/12/2021   11:23 AM  Fall Risk   Falls in the past year? 0 0 0 1 0  Number falls in past yr: 0 0 0 1 0  Injury with Fall? 0  0 1 0  Risk for fall due to : Impaired balance/gait;Impaired mobility;Medication side effect    History of fall(s)  Risk for fall due to: Comment     uses walker  Follow up Falls prevention discussed    Education provided    FALL RISK PREVENTION PERTAINING TO THE HOME:  Any stairs in or around the home? No  If so, are there any without handrails? N/a Home free of loose throw rugs in walkways, pet beds, electrical cords, etc? Yes  Adequate lighting in your home to reduce risk of falls? Yes   ASSISTIVE DEVICES UTILIZED TO PREVENT FALLS:  Life alert? No  Use of a cane, walker or w/c? Yes  Grab bars in the bathroom? Yes  Shower chair or bench in shower? Yes  Elevated  toilet seat or a handicapped toilet? No   TIMED UP AND GO:  Was the test performed? No .      Cognitive Function: 6 CIT not administered. Patient has a diagnosis of Alzheimer's. Followed by neurology.        10/02/2020    1:03 PM  6CIT Screen  What Year? 4 points  What month? 3 points  What time? 3 points  Count back from 20 0 points  Months in reverse 4 points  Repeat phrase 10 points  Total Score 24 points    Immunizations Immunization History  Administered Date(s) Administered   Influenza-Unspecified 09/28/2018   Pneumococcal Conjugate-13 05/15/2017    TDAP status: Due, Education has been provided regarding the importance of this vaccine. Advised may receive this vaccine at local pharmacy or Health Dept. Aware to provide a copy of the vaccination record if obtained from local pharmacy or Health Dept. Verbalized acceptance and understanding.  Flu Vaccine status: Declined, Education has been provided regarding the importance of this vaccine but patient still declined. Advised may receive this vaccine at local pharmacy or Health Dept. Aware to provide a copy of the vaccination record if obtained from local pharmacy or Health Dept. Verbalized acceptance and understanding.  Pneumococcal vaccine status: Declined,  Education has been provided regarding the importance of this vaccine but patient still declined. Advised may receive this vaccine at local pharmacy or Health Dept. Aware to provide a copy of the vaccination record if obtained from local pharmacy or Health Dept. Verbalized acceptance and understanding.   Covid-19 vaccine status: Declined, Education has been provided regarding the importance of this vaccine but patient still declined. Advised may receive this vaccine at local pharmacy or Health Dept.or vaccine clinic. Aware to provide a copy of the vaccination record if obtained from local pharmacy or Health Dept. Verbalized acceptance and understanding.  Qualifies for  Shingles Vaccine? Yes   Zostavax completed No   Shingrix Completed?: No.    Education has been provided regarding the importance of this vaccine. Patient has been advised to call insurance company to determine out of pocket expense if they have not yet received this vaccine. Advised may also receive vaccine at local pharmacy or Health Dept. Verbalized acceptance and understanding.  Screening Tests Health Maintenance  Topic Date Due   Hepatitis C Screening  Never done   DTaP/Tdap/Td (1 - Tdap) Never done   MAMMOGRAM  Never done   DEXA SCAN  Never done   Medicare Annual Wellness (AWV)  11/26/2023   HPV VACCINES  Aged Out   Pneumonia Vaccine 80+ Years old  Discontinued   INFLUENZA VACCINE  Discontinued   COLONOSCOPY (Pts 45-56yrs Insurance coverage will need to be confirmed)  Discontinued   COVID-19 Vaccine  Discontinued   Zoster Vaccines- Shingrix  Discontinued    Health Maintenance  Health Maintenance Due  Topic Date Due   Hepatitis C Screening  Never done   DTaP/Tdap/Td (1 - Tdap) Never done   MAMMOGRAM  Never done   DEXA SCAN  Never done    Colorectal cancer screening: decline  Mammogram status: decline  Bone Density status: decline  Lung Cancer Screening: (Low Dose CT Chest recommended if Age 72-80 years, 30 pack-year currently smoking OR have quit w/in 15years.) does not qualify.   Lung Cancer Screening Referral: no  Additional Screening:  Hepatitis C Screening: does qualify;   Vision Screening: Recommended annual ophthalmology exams for early detection of glaucoma and other disorders of the eye. Is the patient up to date with their annual eye exam?  No  Who is the provider or what is the name of the office in which the patient attends annual eye exams? none If pt is not established with a provider, would they like to be referred to a provider to establish care? No .   Dental Screening: Recommended annual dental exams for proper oral hygiene  Community Resource  Referral / Chronic Care Management: CRR required this visit?  No   CCM required this visit?  No      Plan:     I have personally reviewed and noted the following in the patient's chart:   Medical and social history Use of alcohol, tobacco or illicit drugs  Current medications and supplements including opioid prescriptions. Patient is not currently taking opioid prescriptions. Functional ability and status Nutritional status Physical activity Advanced directives List of other physicians Hospitalizations, surgeries, and ER visits in previous 12 months Vitals Screenings to include cognitive, depression, and falls Referrals and appointments  In addition, I have reviewed and discussed with patient certain preventive protocols, quality metrics, and best practice recommendations. A written personalized care plan for preventive services as well as general preventive health recommendations were provided to patient.     Barb Merino, LPN   16/09/9603   Nurse Notes: none  Due to this being a virtual visit, the after visit summary with patients personalized plan was offered to patient via mail or my-chart.  Patient would like to access on my-chart

## 2022-12-02 ENCOUNTER — Ambulatory Visit: Payer: Medicare Other | Admitting: Family Medicine

## 2022-12-16 ENCOUNTER — Ambulatory Visit: Payer: Medicare Other | Admitting: Family Medicine

## 2022-12-16 ENCOUNTER — Telehealth: Payer: Self-pay | Admitting: Family Medicine

## 2022-12-16 NOTE — Telephone Encounter (Signed)
I cancelled pt off the dar, her daughter could not bring her in today. Her daughter is very sick. She is a no show for 12/16/22 for an OV with Dr Ethelene Hal, I sent a letter.

## 2022-12-19 NOTE — Telephone Encounter (Signed)
did not count as no show, daughter is sick and brings pt to appointments, no fee

## 2023-01-01 ENCOUNTER — Encounter: Payer: Self-pay | Admitting: Family Medicine

## 2023-01-01 ENCOUNTER — Ambulatory Visit (INDEPENDENT_AMBULATORY_CARE_PROVIDER_SITE_OTHER): Payer: Medicare Other | Admitting: Family Medicine

## 2023-01-01 VITALS — BP 138/74 | HR 74 | Temp 97.6°F | Ht 62.0 in | Wt 78.6 lb

## 2023-01-01 DIAGNOSIS — F419 Anxiety disorder, unspecified: Secondary | ICD-10-CM

## 2023-01-01 DIAGNOSIS — F0280 Dementia in other diseases classified elsewhere without behavioral disturbance: Secondary | ICD-10-CM | POA: Diagnosis not present

## 2023-01-01 DIAGNOSIS — M810 Age-related osteoporosis without current pathological fracture: Secondary | ICD-10-CM | POA: Diagnosis not present

## 2023-01-01 DIAGNOSIS — G309 Alzheimer's disease, unspecified: Secondary | ICD-10-CM

## 2023-01-01 MED ORDER — DONEPEZIL HCL 10 MG PO TBDP: 10.0000 mg | ORAL_TABLET | Freq: Every day | ORAL | 1 refills | Status: DC

## 2023-01-01 MED ORDER — PAROXETINE HCL 20 MG PO TABS: 20.0000 mg | ORAL_TABLET | Freq: Every day | ORAL | 1 refills | Status: DC

## 2023-01-01 NOTE — Progress Notes (Signed)
Established Patient Office Visit   Subjective:  Patient ID: Gabrielle Berry, female    DOB: 11-Sep-1951  Age: 72 y.o. MRN: [id number]  Chief Complaint  Patient presents with   Follow-up    3 month follow up, no concerns. Patient fasting.     HPI Encounter Diagnoses  Name Primary?   Senile osteoporosis Yes   Anxiety    Alzheimer's dementia without behavioral disturbance ([place])    Doing well with the Paxil.  Weight has increased.  There is minimal elevation in mood.  She is more active than engaged.  Continues calcium and vitamin D for bone health.  No recent DEXA scan.  She is accompanied by her daughter.   Review of Systems  Constitutional: Negative.  Negative for weight loss.  HENT: Negative.    Eyes:  Negative for blurred vision, discharge and redness.  Respiratory: Negative.    Cardiovascular: Negative.   Gastrointestinal:  Negative for abdominal pain.  Genitourinary: Negative.   Musculoskeletal: Negative.  Negative for myalgias.  Skin:  Negative for rash.  Neurological:  Negative for tingling, loss of consciousness and weakness.  Endo/Heme/Allergies:  Negative for polydipsia.      01/01/2023    9:07 AM 11/25/2022   10:46 AM 09/02/2022   10:25 AM  Depression screen PHQ 2/9  Decreased Interest 0 0 0  Down, Depressed, Hopeless 0 0 0  PHQ - 2 Score 0 0 0       Current Outpatient Medications:    calcium-vitamin D (OSCAL-500) 500-400 MG-UNIT tablet, Take 1 tablet by mouth 2 (two) times daily., Disp: 180 tablet, Rfl: 1   Multiple Vitamin (MULTIVITAMIN ADULT PO), Take 1 tablet by mouth daily., Disp: , Rfl:    phenytoin (DILANTIN) 100 MG ER capsule, Take 3 capsules every night, Disp: 270 capsule, Rfl: 3   donepezil (ARICEPT ODT) 10 MG disintegrating tablet, Take 1 tablet (10 mg total) by mouth at bedtime., Disp: 90 tablet, Rfl: 1   PARoxetine (PAXIL) 20 MG tablet, Take 1 tablet (20 mg total) by mouth daily., Disp: 90 tablet, Rfl: 1   Objective:     BP 138/74 (BP  Location: Right Arm, Patient Position: Sitting, Cuff Size: Normal)   Pulse 74   Temp 97.6 F (36.4 C) (Temporal)   Ht 5\' 2"  (1.575 m)   Wt 78 lb 9.6 oz (35.7 kg)   LMP  (LMP Unknown)   SpO2 100%   BMI 14.38 kg/m  BP Readings from Last 3 Encounters:  01/01/23 138/74  09/02/22 (!) 144/72  08/04/22 132/66   Wt Readings from Last 3 Encounters:  01/01/23 78 lb 9.6 oz (35.7 kg)  11/25/22 70 lb (31.8 kg)  09/02/22 70 lb 12.8 oz (32.1 kg)      Physical Exam Constitutional:      General: She is not in acute distress.    Appearance: Normal appearance. She is not ill-appearing, toxic-appearing or diaphoretic.  HENT:     Head: Normocephalic and atraumatic.     Right Ear: External ear normal.     Left Ear: External ear normal.     Mouth/Throat:     Mouth: Mucous membranes are moist.     Pharynx: Oropharynx is clear. No oropharyngeal exudate or posterior oropharyngeal erythema.  Eyes:     General: No scleral icterus.       Right eye: No discharge.        Left eye: No discharge.     Extraocular Movements: Extraocular movements intact.  Conjunctiva/sclera: Conjunctivae normal.     Pupils: Pupils are equal, round, and reactive to light.  Cardiovascular:     Rate and Rhythm: Normal rate and regular rhythm.  Pulmonary:     Effort: Pulmonary effort is normal. No respiratory distress.     Breath sounds: Normal breath sounds. Decreased air movement present.  Musculoskeletal:     Cervical back: No rigidity or tenderness.  Skin:    General: Skin is warm and dry.  Neurological:     Mental Status: She is alert and oriented to person, place, and time.  Psychiatric:        Mood and Affect: Mood normal.        Behavior: Behavior normal.      No results found for any visits on 01/01/23.    The 10-year ASCVD risk score (Arnett DK, et al., 2019) is: 14.9%    Assessment & Plan:   Senile osteoporosis -     DG Bone Density; Future  Anxiety -     PARoxetine HCl; Take 1 tablet  (20 mg total) by mouth daily.  Dispense: 90 tablet; Refill: 1  Alzheimer's dementia without behavioral disturbance (HCC) -     Donepezil HCl; Take 1 tablet (10 mg total) by mouth at bedtime.  Dispense: 90 tablet; Refill: 1    Return in about 3 months (around 04/02/2023).  With increased weight and activity and improved mood, will continue Paxil.  Aricept seems to be helping.  Patient has been more engaged attentive.  She is interested in going to an adult daycare.  Libby Maw, MD

## 2023-01-13 ENCOUNTER — Telehealth (HOSPITAL_BASED_OUTPATIENT_CLINIC_OR_DEPARTMENT_OTHER): Payer: Self-pay

## 2023-01-15 ENCOUNTER — Other Ambulatory Visit (HOSPITAL_COMMUNITY): Payer: Self-pay

## 2023-01-15 ENCOUNTER — Telehealth: Payer: Self-pay

## 2023-01-15 NOTE — Telephone Encounter (Signed)
PA request received via CMM for Phenytoin Sodium Extended 100MG  capsules  PA was submitted to [facility] and determination was cancelled due to medication being available to the patient at this time.   Key: [id number]

## 2023-01-20 ENCOUNTER — Telehealth: Payer: Self-pay

## 2023-01-20 NOTE — Telephone Encounter (Signed)
It is generic. We cannot just switch seizure medications. If they don't have it, will have to get from a different pharmacy?

## 2023-01-20 NOTE — Telephone Encounter (Signed)
[facility] called in requesting to switch a medication, phenytoin (DILANTIN) 100 MG ER capsule for the patient as they do not have the current brand in stock.

## 2023-01-20 NOTE — Telephone Encounter (Signed)
They would like to change manufactures not medication. OK was given by Dr Delice Lesch to use a different manufacture,

## 2023-02-01 ENCOUNTER — Encounter: Payer: Self-pay | Admitting: Neurology

## 2023-03-02 ENCOUNTER — Ambulatory Visit: Payer: Medicare Other | Admitting: Family Medicine

## 2023-03-18 ENCOUNTER — Telehealth: Payer: Self-pay | Admitting: Family Medicine

## 2023-03-18 NOTE — Telephone Encounter (Signed)
Pt's daughter, [name] is calling in concerned over her mom. [name] feels her mom is having a difficulty with her memory and feels her alzheimer's is getting worse. She does not know what to do any longer. Please advise [name] at [phone] [facility])

## 2023-03-18 NOTE — Telephone Encounter (Signed)
Appointment scheduled for OV per patients daughter pt just recently got over a cold was treated by different doctor now it seems as if patient is more confused. Incontinence for urine and stool.

## 2023-03-19 ENCOUNTER — Ambulatory Visit (INDEPENDENT_AMBULATORY_CARE_PROVIDER_SITE_OTHER): Payer: Medicare Other | Admitting: Family Medicine

## 2023-03-19 ENCOUNTER — Encounter: Payer: Self-pay | Admitting: Family Medicine

## 2023-03-19 VITALS — BP 118/74 | HR 89 | Temp 97.9°F | Ht 62.0 in | Wt 77.8 lb

## 2023-03-19 DIAGNOSIS — F02818 Dementia in other diseases classified elsewhere, unspecified severity, with other behavioral disturbance: Secondary | ICD-10-CM | POA: Diagnosis not present

## 2023-03-19 DIAGNOSIS — R32 Unspecified urinary incontinence: Secondary | ICD-10-CM | POA: Diagnosis not present

## 2023-03-19 DIAGNOSIS — R410 Disorientation, unspecified: Secondary | ICD-10-CM

## 2023-03-19 DIAGNOSIS — G309 Alzheimer's disease, unspecified: Secondary | ICD-10-CM | POA: Diagnosis not present

## 2023-03-19 LAB — POCT URINALYSIS DIPSTICK
Bilirubin, UA: NEGATIVE
Blood, UA: NEGATIVE
Glucose, UA: NEGATIVE
Ketones, UA: NEGATIVE
Leukocytes, UA: NEGATIVE
Nitrite, UA: NEGATIVE
Protein, UA: NEGATIVE
Spec Grav, UA: <=1.005 — AB (ref 1.010–1.025)
Urobilinogen, UA: 0.2 E.U./dL
pH, UA: 6.5 (ref 5.0–8.0)

## 2023-03-19 NOTE — Progress Notes (Signed)
[facility] [address] [place] [address] Dept: [phone] Dept Fax: [phone]  Office Visit  Subjective:    Patient ID: Gabrielle Berry, female    DOB: 08/29/1951, 72 y.o..   MRN: [id number]  Chief Complaint  Patient presents with   Altered Mental Status    C/o having incontinence issues (worse), sleeping a lot and delusions x 2-3 weeks.    History of Present Illness:  Patient is in today with her daughter, Gabrielle Berry. [name] has Alzheimer's dementia. Her daughter notes that [name] had symptoms of a respiratory illness a few weeks ago. She had a mildly elevated temperature. She was seen by a physician in [place]. She was treated with a 10-day course of amoxicillin. Although her respiratory illness has seemed better, her daughter has concerns for behavioral changes. She notes her mother is spending increased amounts of time in bed, both sleeping and unwilling to get up. She is incontinent of both urine and stool. Although they have tried using adult diapers, her mother pulls these off at times. [name] is reported to have some delusional thinking. She also seems to have more confusion. [name] was previously in a nursing home in [facility], prior to her daughter bringing her to live in her home. [name]'s weight was down to about 67 lbs. Her daughter has noted her to be eating better. She notes that it is harder to get her mother to bathe now.  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   Skin sensation disturbance 04/15/2021   Tremor 04/15/2021   Unspecified severe protein-calorie malnutrition 04/15/2021   Weight loss 04/15/2021   Healthcare  maintenance 04/15/2021   Cough 04/15/2021   Senile osteoporosis 10/04/2020   Osteoporotic fracture of left hip 10/04/2020   Seizure 05/18/2020   Alzheimer's dementia with behavioral disturbance 05/18/2020   Anxiety 05/18/2020   History of Guillain-Barre syndrome 05/18/2020   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 180 tablet 1   donepezil (ARICEPT ODT) 10 MG disintegrating tablet Take 1 tablet (10 mg total) by mouth at bedtime. 90 tablet 1   Multiple Vitamin (MULTIVITAMIN ADULT PO) Take 1 tablet by mouth daily.     PARoxetine (PAXIL) 20 MG tablet Take 1 tablet (20 mg total) by mouth daily. 90 tablet 1   phenytoin (DILANTIN) 100 MG ER capsule Take 3 capsules every night 270 capsule 3   No facility-administered medications prior to visit.   Allergies  Allergen Reactions   Haemophilus Influenzae Vaccines Other (See Comments)    History of Guillain Barre Syndrome 1997   Penicillins      Objective:   Today's Vitals   03/19/23 1057  BP: 118/74  Pulse: 89  Temp: 97.9 F (36.6 C)  TempSrc: Temporal  SpO2: 96%  Weight: 77 lb 12.8 oz (35.3 kg)  Height: 5\' 2"  (1.575 m)   Body mass index is 14.23 kg/m.   General: Thin elderly woman. No acute distress. Abdomen: Soft, non-tender. Neuro: Moderately severe tremor of upper extremities. Psych: Alert. Is pleasant but does not seem aware of what is being discussed. Normal mood and affect.  Health Maintenance Due  Topic Date Due   Hepatitis C Screening  Never done   DTaP/Tdap/Td (1 - Tdap) Never done   MAMMOGRAM   Never done   DEXA SCAN  Never done   Lab Results Component Ref Range & Units 11:41 1 yr ago 2 yr ago  Color, UA clear    Clarity, UA clear    Glucose, UA Negative Negative    Bilirubin, UA neg    Ketones, UA neg    Spec Grav, UA 1.010 - 1.025 <=1.005 Abnormal     Comment: 1.000  Blood, UA neg    pH, UA 5.0 - 8.0 6.5    Protein, UA Negative Negative    Urobilinogen, UA 0.2 or 1.0 E.U./dL 0.2    Nitrite, UA neg    Leukocytes, UA Negative Negative        Assessment & Plan:  1. Urinary incontinence, unspecified type Urine is clear, so no evidence of UTI. I suspect the incontinence is a component of her worsening dementia. - Urinalysis w microscopic + reflex cultur - POCT Urinalysis Dipstick  2. Confusion There is no clear sign of infection as a cause for increased confusion. The symptoms sound suggestive of worsening dementia. This includes the avoidance of bathing, which is common with advanced dementia. We discussed ways Gabrielle Berry might help with this.   - Urinalysis w microscopic + reflex cultur  3. Alzheimer's dementia with behavioral disturbance Overall, it appears [name] is having progressive dementia. Her daughyer is finding it more difficult with time to help manage her needs at home, but wants to try. In looking at the progression of symptoms, I feel it would be appropriate to involve Hospice at this point.   - Ambulatory referral to Hospice  Return if symptoms worsen or fail to improve.   [name], MD

## 2023-03-20 LAB — URINALYSIS W MICROSCOPIC + REFLEX CULTURE
Bacteria, UA: NONE SEEN /HPF
Bilirubin Urine: NEGATIVE
Glucose, UA: NEGATIVE
Hgb urine dipstick: NEGATIVE
Hyaline Cast: NONE SEEN /LPF
Ketones, ur: NEGATIVE
Leukocyte Esterase: NEGATIVE
Nitrites, Initial: NEGATIVE
Protein, ur: NEGATIVE
RBC / HPF: NONE SEEN /HPF (ref 0–2)
Specific Gravity, Urine: 1.002 (ref 1.001–1.035)
Squamous Epithelial / HPF: NONE SEEN /HPF (ref <=5)
WBC, UA: NONE SEEN /HPF (ref 0–5)
pH: 7 (ref 5.0–8.0)

## 2023-03-20 LAB — NO CULTURE INDICATED

## 2023-04-02 ENCOUNTER — Ambulatory Visit: Payer: Medicare Other | Admitting: Family Medicine

## 2023-04-06 ENCOUNTER — Encounter: Payer: Self-pay | Admitting: Neurology

## 2023-04-06 ENCOUNTER — Ambulatory Visit: Payer: Medicare Other | Admitting: Neurology

## 2023-04-14 ENCOUNTER — Ambulatory Visit (INDEPENDENT_AMBULATORY_CARE_PROVIDER_SITE_OTHER): Payer: Medicare Other | Admitting: Family Medicine

## 2023-04-14 ENCOUNTER — Encounter: Payer: Self-pay | Admitting: Family Medicine

## 2023-04-14 VITALS — BP 115/64 | HR 80 | Temp 97.3°F | Ht 62.0 in | Wt 79.4 lb

## 2023-04-14 DIAGNOSIS — G309 Alzheimer's disease, unspecified: Secondary | ICD-10-CM

## 2023-04-14 DIAGNOSIS — R569 Unspecified convulsions: Secondary | ICD-10-CM

## 2023-04-14 DIAGNOSIS — F419 Anxiety disorder, unspecified: Secondary | ICD-10-CM

## 2023-04-14 DIAGNOSIS — F0280 Dementia in other diseases classified elsewhere without behavioral disturbance: Secondary | ICD-10-CM | POA: Diagnosis not present

## 2023-04-14 LAB — CBC
HCT: 37.8 % (ref 36.0–46.0)
Hemoglobin: 12.8 g/dL (ref 12.0–15.0)
MCHC: 33.9 g/dL (ref 30.0–36.0)
MCV: 95.4 fl (ref 78.0–100.0)
Platelets: 256 10*3/uL (ref 150.0–400.0)
RBC: 3.96 Mil/uL (ref 3.87–5.11)
RDW: 12.9 % (ref 11.5–15.5)
WBC: 5.4 10*3/uL (ref 4.0–10.5)

## 2023-04-14 LAB — COMPREHENSIVE METABOLIC PANEL
ALT: 21 U/L (ref 0–35)
AST: 29 U/L (ref 0–37)
Albumin: 3.5 g/dL (ref 3.5–5.2)
Alkaline Phosphatase: 166 U/L — ABNORMAL HIGH (ref 39–117)
BUN: 12 mg/dL (ref 6–23)
CO2: 25 mEq/L (ref 19–32)
Calcium: 9.1 mg/dL (ref 8.4–10.5)
Chloride: 105 mEq/L (ref 96–112)
Creatinine, Ser: 0.6 mg/dL (ref 0.40–1.20)
GFR: 88.65 mL/min (ref >60.00)
Glucose, Bld: 80 mg/dL (ref 70–99)
Potassium: 4.3 mEq/L (ref 3.5–5.1)
Sodium: 139 mEq/L (ref 135–145)
Total Bilirubin: 0.3 mg/dL (ref 0.2–1.2)
Total Protein: 7.9 g/dL (ref 6.0–8.3)

## 2023-04-14 MED ORDER — PHENYTOIN SODIUM EXTENDED 100 MG PO CAPS: ORAL_CAPSULE | ORAL | 3 refills | Status: DC

## 2023-04-14 MED ORDER — DONEPEZIL HCL 10 MG PO TBDP: 10.0000 mg | ORAL_TABLET | Freq: Every day | ORAL | 1 refills | Status: DC

## 2023-04-14 MED ORDER — PAROXETINE HCL 20 MG PO TABS: 20.0000 mg | ORAL_TABLET | Freq: Every day | ORAL | 1 refills | Status: DC

## 2023-04-14 NOTE — Progress Notes (Signed)
Established Patient Office Visit   Subjective:  Patient ID: Gabrielle Berry, female    DOB: November 01, 1951  Age: 72 y.o. MRN: [id number]  Chief Complaint  Patient presents with   Medical Management of Chronic Issues    3 month follow up, no concerns. Patient not fasting.     HPI Encounter Diagnoses  Name Primary?   Anxiety Yes   Alzheimer's dementia without behavioral disturbance (HCC)    Seizure (HCC)    For follow-up of last visit when she was evaluated for lethargy and incontinence of stool and urine.  Fortunately she has recovered.  Continence has returned.  She is up and about, exercising to the extent that she is able and her usual self.  She continues to use her rollator without issue.  She lives with her daughter, [name].  Elizabeth's husband and 2 daughters also live in the house.  1 daughter is 72 years old in high school second daughter is physically and mentally disabled with a history of spinal bifida hydrocephalus.  She is wheelchair-bound.  She remains seizure-free on 300 mg of Dilantin taking at night.  She has been on this dosing schedule for years.  Neurology is aware.   Review of Systems  Constitutional: Negative.   HENT: Negative.    Eyes:  Negative for blurred vision, discharge and redness.  Respiratory: Negative.    Cardiovascular: Negative.   Gastrointestinal:  Negative for abdominal pain.  Genitourinary: Negative.   Musculoskeletal: Negative.  Negative for myalgias.  Skin:  Negative for rash.  Neurological:  Negative for tingling, loss of consciousness and weakness.  Endo/Heme/Allergies:  Negative for polydipsia.     Current Outpatient Medications:    calcium-vitamin D (OSCAL-500) 500-400 MG-UNIT tablet, Take 1 tablet by mouth 2 (two) times daily., Disp: 180 tablet, Rfl: 1   Multiple Vitamin (MULTIVITAMIN ADULT PO), Take 1 tablet by mouth daily., Disp: , Rfl:    donepezil (ARICEPT ODT) 10 MG disintegrating tablet, Take 1 tablet (10 mg total) by mouth at  bedtime., Disp: 90 tablet, Rfl: 1   PARoxetine (PAXIL) 20 MG tablet, Take 1 tablet (20 mg total) by mouth daily., Disp: 90 tablet, Rfl: 1   phenytoin (DILANTIN) 100 MG ER capsule, Take 3 capsules every night, Disp: 270 capsule, Rfl: 3   Objective:     BP 115/64 (BP Location: Right Arm, Patient Position: Sitting, Cuff Size: Small)   Pulse 80   Temp (!) 97.3 F (36.3 C) (Temporal)   Ht 5\' 2"  (1.575 m)   Wt 79 lb 6.4 oz (36 kg)   LMP  (LMP Unknown)   SpO2 95%   BMI 14.52 kg/m    Physical Exam Constitutional:      General: She is not in acute distress.    Appearance: Normal appearance. She is not ill-appearing, toxic-appearing or diaphoretic.  HENT:     Head: Normocephalic and atraumatic.     Right Ear: External ear normal.     Left Ear: External ear normal.  Eyes:     General: No scleral icterus.       Right eye: No discharge.        Left eye: No discharge.     Extraocular Movements: Extraocular movements intact.     Conjunctiva/sclera: Conjunctivae normal.  Cardiovascular:     Rate and Rhythm: Normal rate and regular rhythm.  Pulmonary:     Effort: Pulmonary effort is normal. No respiratory distress.     Breath sounds: Normal breath sounds.  Skin:  General: Skin is warm and dry.  Neurological:     Mental Status: She is alert and oriented to person, place, and time.  Psychiatric:        Attention and Perception: Attention normal.        Mood and Affect: Mood normal.        Speech: Speech normal.        Behavior: Behavior normal. Behavior is cooperative.      No results found for any visits on 04/14/23.    The 10-year ASCVD risk score (Arnett DK, et al., 2019) is: 11.9%    Assessment & Plan:   Anxiety -     PARoxetine HCl; Take 1 tablet (20 mg total) by mouth daily.  Dispense: 90 tablet; Refill: 1  Alzheimer's dementia without behavioral disturbance (HCC) -     Donepezil HCl; Take 1 tablet (10 mg total) by mouth at bedtime.  Dispense: 90 tablet; Refill:  1  Seizure (HCC) -     Phenytoin Sodium Extended; Take 3 capsules every night  Dispense: 270 capsule; Refill: 3 -     CBC -     Comprehensive metabolic panel -     Phenytoin level, total    Return in about 3 months (around 07/14/2023).    [name], MD

## 2023-04-15 LAB — PHENYTOIN LEVEL, TOTAL: Phenytoin, Total: 12.7 mg/L (ref 10.0–20.0)

## 2023-07-14 ENCOUNTER — Ambulatory Visit: Payer: Medicare Other | Admitting: Family Medicine

## 2023-07-15 ENCOUNTER — Encounter (INDEPENDENT_AMBULATORY_CARE_PROVIDER_SITE_OTHER): Payer: Self-pay

## 2023-07-15 ENCOUNTER — Ambulatory Visit: Payer: Medicare Other | Admitting: Family Medicine

## 2023-07-21 ENCOUNTER — Ambulatory Visit (HOSPITAL_BASED_OUTPATIENT_CLINIC_OR_DEPARTMENT_OTHER): Admission: RE | Admit: 2023-07-21 | Payer: Medicare Other | Source: Ambulatory Visit

## 2023-07-21 ENCOUNTER — Telehealth: Payer: Self-pay

## 2023-07-21 ENCOUNTER — Ambulatory Visit (INDEPENDENT_AMBULATORY_CARE_PROVIDER_SITE_OTHER): Payer: Medicare Other | Admitting: Internal Medicine

## 2023-07-21 ENCOUNTER — Ambulatory Visit (HOSPITAL_BASED_OUTPATIENT_CLINIC_OR_DEPARTMENT_OTHER)
Admission: RE | Admit: 2023-07-21 | Discharge: 2023-07-21 | Disposition: A | Discharge status: Home / Self Care | Payer: Medicare Other | Source: Ambulatory Visit | Attending: Internal Medicine | Admitting: Internal Medicine

## 2023-07-21 ENCOUNTER — Ambulatory Visit (HOSPITAL_BASED_OUTPATIENT_CLINIC_OR_DEPARTMENT_OTHER)
Admission: RE | Admit: 2023-07-21 | Discharge: 2023-07-21 | Disposition: A | Discharge status: Home / Self Care | Payer: Medicare Other | Source: Ambulatory Visit | Attending: Family Medicine | Admitting: Family Medicine

## 2023-07-21 ENCOUNTER — Encounter: Payer: Self-pay | Admitting: Internal Medicine

## 2023-07-21 VITALS — BP 96/60 | HR 86 | Temp 99.3°F | Ht 62.0 in | Wt 79.2 lb

## 2023-07-21 DIAGNOSIS — W19XXXA Unspecified fall, initial encounter: Secondary | ICD-10-CM | POA: Insufficient documentation

## 2023-07-21 DIAGNOSIS — Y939 Activity, unspecified: Secondary | ICD-10-CM | POA: Insufficient documentation

## 2023-07-21 DIAGNOSIS — M79604 Pain in right leg: Secondary | ICD-10-CM

## 2023-07-21 DIAGNOSIS — M810 Age-related osteoporosis without current pathological fracture: Secondary | ICD-10-CM

## 2023-07-21 DIAGNOSIS — Z780 Asymptomatic menopausal state: Secondary | ICD-10-CM | POA: Insufficient documentation

## 2023-07-21 DIAGNOSIS — R102 Pelvic and perineal pain: Secondary | ICD-10-CM | POA: Insufficient documentation

## 2023-07-21 DIAGNOSIS — S329XXA Fracture of unspecified parts of lumbosacral spine and pelvis, initial encounter for closed fracture: Secondary | ICD-10-CM

## 2023-07-21 DIAGNOSIS — Y929 Unspecified place or not applicable: Secondary | ICD-10-CM | POA: Insufficient documentation

## 2023-07-21 NOTE — Progress Notes (Signed)
[facility] [address] [place] [address] Dept: [phone] Dept Fax: [phone]  Acute Care Office Visit  Subjective:   Gabrielle Berry 1951/11/01 07/21/2023  Chief Complaint  Patient presents with   Genia Hotter yesterday pain in thighs when walking, taking tylenol     HPI: Discussed the use of AI scribe software for clinical note transcription with the patient, who gave verbal consent to proceed.  History of Present Illness   The patient, with hx of dementia and osteoporosis had an unwitnessed fall at home 1 day ago. The patient was found on the linoleum floor by her daughter, who reports that the patient was sitting up and denied falling initially. The patient has not experienced any loss of consciousness or vomiting. However, she has been experiencing pain in her right thigh since the fall. The patient's daughter reports no visible bruising or swelling but is concerned about a possible fracture. Patient has hx of right hip surgery. The patient's pain increases upon standing and walking. She has decreased sensation in toes bilaterally,which is chronic from GBS.        The following portions of the patient's history were reviewed and updated as appropriate: past medical history, past surgical history, family history, social history, allergies, medications, and problem list.   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   Skin sensation disturbance 04/15/2021   Tremor 04/15/2021   Unspecified severe protein-calorie malnutrition (HCC) 04/15/2021   Weight loss 04/15/2021   Healthcare maintenance 04/15/2021   Cough 04/15/2021   Senile osteoporosis 10/04/2020   Osteoporotic  fracture of left hip (HCC) 10/04/2020   Seizure (HCC) 05/18/2020   Alzheimer's dementia with behavioral disturbance (HCC) 05/18/2020   Anxiety 05/18/2020   History of Guillain-Barre syndrome 05/18/2020   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 180 tablet 1   donepezil (ARICEPT ODT) 10 MG disintegrating tablet Take 1 tablet (10 mg total) by mouth at bedtime. 90 tablet 1   Multiple Vitamin (MULTIVITAMIN ADULT PO) Take 1 tablet by mouth daily.     PARoxetine (PAXIL) 20 MG tablet Take 1 tablet (20 mg total) by mouth daily. 90 tablet 1   phenytoin (DILANTIN) 100 MG ER capsule Take 3 capsules every night 270 capsule 3   No facility-administered medications prior to visit.   Allergies  Allergen Reactions   Haemophilus Influenzae Vaccines Other (See Comments)    History of Guillain Barre Syndrome 1997   Penicillins      ROS: A complete ROS was performed with pertinent positives/negatives noted in the HPI. The  remainder of the ROS are negative.    Objective:   Today's Vitals   07/21/23 1314  BP: 96/60  Pulse: 86  Temp: 99.3 F (37.4 C)  TempSrc: Temporal  SpO2: 95%  Weight: 79 lb 3.2 oz (35.9 kg)  Height: 5\' 2"  (1.575 m)    GENERAL: Well-appearing, in NAD. Well nourished.  SKIN: Pink, warm and dry. No rash.  NECK: Trachea midline. Full ROM w/o pain or tenderness. No lymphadenopathy.  RESPIRATORY: Chest  wall symmetrical. No tenderness to rib cage with firm palpation. Respirations even and non-labored. Breath sounds clear to auscultation bilaterally.  CARDIAC: S1, S2 present, regular rate and rhythm. Peripheral pulses 2+ bilaterally.  MSK: Muscle tone and strength appropriate for age. TTP to R. Pelvis and R. Medial thigh. Pain with ambulation to R. Thigh.  EXTREMITIES: Without clubbing, cyanosis, or edema.  PSYCH/MENTAL STATUS: Alert, oriented x person.  Cooperative, appropriate mood and affect.    No results found for any visits on 07/21/23.    Assessment & Plan:  1. Pelvic pain - XR HIPS BILAT W OR W/O PELVIS 3-4 VIEWS  2. Pain of right anterior lower extremity - XR HIPS BILAT W OR W/O PELVIS 3-4 VIEWS - XR FEMUR, MIN 2 VIEWS RIGHT  3. Fall, initial encounter - DG Cervical Spine Complete; Future - XR FEMUR, MIN 2 VIEWS RIGHT      Orders Placed This Encounter  Procedures   DG Cervical Spine Complete    Standing Status:   Future    Number of Occurrences:   1    Standing Expiration Date:   01/21/2024    Order Specific Question:   Reason for Exam (SYMPTOM  OR DIAGNOSIS REQUIRED)    Answer:   unwitnessed fall 1 day ago    Order Specific Question:   Preferred imaging location?    Answer:   [facility]   XR FEMUR, MIN 2 VIEWS RIGHT    Order Specific Question:   Reason for Exam (SYMPTOM  OR DIAGNOSIS REQUIRED)    Answer:   fall x 1 day, thigh pain    Order Specific Question:   Preferred imaging location?    Answer:   [facility]   DG HIP UNILAT WITH PELVIS 2-3 VIEWS LEFT    Order Specific Question:   Reason for Exam (SYMPTOM  OR DIAGNOSIS REQUIRED)    Answer:   fall x 1 day. right Hip and pelvic pain    Order Specific Question:   Preferred imaging location?    Answer:   [facility]    No orders of the defined types were placed in this encounter.  Lab Orders  No laboratory test(s) ordered today   No images are attached to the encounter or orders  placed in the encounter.  Return if symptoms worsen or fail to improve.   Of note, portions of this note may have been created with voice recognition software [employer]ic[employer], medical). While this note has been edited for accuracy, occasional wrong-word or 'sound-a-like' substitutions may have occurred due to the inherent limitations of voice recognition software.  Salvatore Decent, FNP

## 2023-07-21 NOTE — Telephone Encounter (Signed)
Patients daughter informed of x-ray results.

## 2023-07-22 ENCOUNTER — Other Ambulatory Visit (HOSPITAL_BASED_OUTPATIENT_CLINIC_OR_DEPARTMENT_OTHER): Payer: Medicare Other

## 2023-07-23 ENCOUNTER — Telehealth: Payer: Self-pay | Admitting: Family Medicine

## 2023-07-23 NOTE — Telephone Encounter (Signed)
Pt daughter called and stated she is waiting for a call from you about an orthopedic. Pt daughter was told you will call her within 2 days and she have not got a call yet

## 2023-07-27 ENCOUNTER — Encounter: Payer: Self-pay | Admitting: Internal Medicine

## 2023-07-30 ENCOUNTER — Encounter: Payer: Self-pay | Admitting: Family Medicine

## 2023-07-30 ENCOUNTER — Ambulatory Visit (INDEPENDENT_AMBULATORY_CARE_PROVIDER_SITE_OTHER): Payer: Medicare Other | Admitting: Family Medicine

## 2023-07-30 VITALS — BP 106/64 | HR 70 | Temp 98.0°F | Ht 62.0 in | Wt 78.6 lb

## 2023-07-30 DIAGNOSIS — S329XXA Fracture of unspecified parts of lumbosacral spine and pelvis, initial encounter for closed fracture: Secondary | ICD-10-CM

## 2023-07-30 DIAGNOSIS — M810 Age-related osteoporosis without current pathological fracture: Secondary | ICD-10-CM

## 2023-07-30 MED ORDER — ALENDRONATE SODIUM 70 MG PO TABS: 70.0000 mg | ORAL_TABLET | ORAL | 3 refills | Status: DC

## 2023-07-30 NOTE — Progress Notes (Signed)
Established Patient Office Visit   Subjective:  Patient ID: Gabrielle Berry, female    DOB: [birth year]  Age: 72 y.o. MRN: [id number]  Chief Complaint  Patient presents with   Medical Management of Chronic Issues    3 month follow. Pt had a fall 1 week ago and fracture right leg.     HPI Encounter Diagnoses  Name Primary?   Closed nondisplaced fracture of pelvis, unspecified part of pelvis, initial encounter (HCC) Yes   Senile osteoporosis    For follow-up of nondisplaced pubic ramus fracture that occurred after an unwitnessed fall back on the fifth of this month.  She is doing okay.  She is using a rollator for ambulation.  Pain is improving.  Was referred to orthopedics but has not received an appointment.  Recent bone scan revealed a T-score of -5.8.  She is edentulous.  She has taken Fosamax in the past but not sure for how long.  She quit smoking 50 years ago.  Experiencing weight   Review of Systems  Constitutional: Negative.   HENT: Negative.    Eyes:  Negative for blurred vision, discharge and redness.  Respiratory: Negative.    Cardiovascular: Negative.   Gastrointestinal:  Negative for abdominal pain.  Genitourinary: Negative.   Musculoskeletal: Negative.  Negative for myalgias.  Skin:  Negative for rash.  Neurological:  Negative for tingling, loss of consciousness and weakness.  Endo/Heme/Allergies:  Negative for polydipsia.     Current Outpatient Medications:    alendronate (FOSAMAX) 70 MG tablet, Take 1 tablet (70 mg total) by mouth every 7 (seven) days. Take with a full glass of water on an empty stomach., Disp: 12 tablet, Rfl: 3   calcium-vitamin D (OSCAL-500) 500-400 MG-UNIT tablet, Take 1 tablet by mouth 2 (two) times daily., Disp: 180 tablet, Rfl: 1   donepezil (ARICEPT ODT) 10 MG disintegrating tablet, Take 1 tablet (10 mg total) by mouth at bedtime., Disp: 90 tablet, Rfl: 1   Multiple Vitamin (MULTIVITAMIN ADULT PO), Take 1 tablet by mouth daily., Disp: , Rfl:     PARoxetine (PAXIL) 20 MG tablet, Take 1 tablet (20 mg total) by mouth daily., Disp: 90 tablet, Rfl: 1   phenytoin (DILANTIN) 100 MG ER capsule, Take 3 capsules every night, Disp: 270 capsule, Rfl: 3   Objective:     BP 106/64   Pulse 70   Temp 98 F (36.7 C)   Ht 5\' 2"  (1.575 m)   Wt 78 lb 9.6 oz (35.7 kg)   LMP  (LMP Unknown)   SpO2 92%   BMI 14.38 kg/m    Physical Exam Constitutional:      General: She is not in acute distress.    Appearance: Normal appearance. She is not ill-appearing, toxic-appearing or diaphoretic.  HENT:     Head: Normocephalic and atraumatic.     Right Ear: External ear normal.     Left Ear: External ear normal.  Eyes:     General: No scleral icterus.       Right eye: No discharge.        Left eye: No discharge.     Extraocular Movements: Extraocular movements intact.     Conjunctiva/sclera: Conjunctivae normal.  Pulmonary:     Effort: Pulmonary effort is normal. No respiratory distress.  Skin:    General: Skin is warm and dry.  Neurological:     Mental Status: She is alert and oriented to person, place, and time.  Psychiatric:  Mood and Affect: Mood normal.        Behavior: Behavior normal.      No results found for any visits on 07/30/23.    The 10-year ASCVD risk score (Arnett DK, et al., 2019) is: 10.2%    Assessment & Plan:   Closed nondisplaced fracture of pelvis, unspecified part of pelvis, initial encounter [facility]) -     Ambulatory referral to Orthopedic Surgery -     Walker rolling  Senile osteoporosis -     Alendronate Sodium; Take 1 tablet (70 mg total) by mouth every 7 (seven) days. Take with a full glass of water on an empty stomach.  Dispense: 12 tablet; Refill: 3    Return in about 3 months (around 10/30/2023), or Continue multivitamin with 800 IUs of vitamin D and 1200 mg of calcium daily..  Will start alendronate and treat for 5 years.  Advised to also take 800 IUs of vitamin D and 1200 mg of calcium.   Exercise as tolerated with a rollator.  Follow-up with orthopedic surgery for consultation of nondisplaced pubic rami fracture.  Information was given on osteoporosis and preventing falls.  [name], MD

## 2023-08-10 ENCOUNTER — Ambulatory Visit (INDEPENDENT_AMBULATORY_CARE_PROVIDER_SITE_OTHER): Payer: Medicare Other | Admitting: Physician Assistant

## 2023-08-10 ENCOUNTER — Other Ambulatory Visit (INDEPENDENT_AMBULATORY_CARE_PROVIDER_SITE_OTHER): Payer: Medicare Other

## 2023-08-10 ENCOUNTER — Ambulatory Visit: Payer: Medicare Other | Admitting: Neurology

## 2023-08-10 ENCOUNTER — Encounter: Payer: Self-pay | Admitting: Physician Assistant

## 2023-08-10 DIAGNOSIS — M25551 Pain in right hip: Secondary | ICD-10-CM | POA: Diagnosis not present

## 2023-08-10 NOTE — Progress Notes (Signed)
HPI: Patient 72 year old female who presents today as a new patient for pelvic fracture.  She sustained a nondisplaced right pubic rami fracture on July 20, 2023.  This was a unwitnessed fall.  However her daughter states that she found her lying down.  Patient's medical history pertinent for history of Guillain-Barr syndrome, Alzheimer's dementia with behavioral disturbance, COPD, osteoporosis, and history right hip fracture with internal fixation. Radiographs dated 07/21/2023 AP pelvis are reviewed and shows a right inferior pubic rami fracture nondisplaced.  Significant degenerative changes bilateral hips.  Hardware present from previous internal fixation of proximal right femur fracture without any complicating features.  Bilateral hips are well located.  Review of systems see HPI otherwise negative.  General: Thin appearing female in no acute distress.  Ambulates with a rollator.  Bilateral hips: Greatly diminished internal and external rotation without significant pain.  Radiographs: AP pelvis: Shows bilateral hips to be well located.  Inferior superior rami fractures seen on the right remain nondisplaced.  Some early consolidation of the superior rami fracture.  Significant overlying bowel content.  No acute fractures or findings otherwise.  Plan: Reviewed the radiographs with patient and her daughter who is present throughout the exam today.  Explained to them that take 8 to 12 weeks for the fractures to completely heal.  She could be weightbearing as tolerated.  She will follow-up with her primary care physician for the rami fracture on the right side.  From our standpoint there is no surgical intervention no further radiographs needed.  She can return back to the memory unit at [facility].

## 2023-08-11 ENCOUNTER — Ambulatory Visit: Payer: Medicare Other | Admitting: Neurology

## 2023-08-25 ENCOUNTER — Ambulatory Visit: Payer: Medicare Other | Admitting: Neurology

## 2023-08-27 ENCOUNTER — Telehealth: Payer: Medicare Other | Admitting: Family Medicine

## 2023-08-27 NOTE — Telephone Encounter (Signed)
[name] / daughter [phone]   Pts daughter has expressed that her mom has dementia and they are ready to start a process and she would like recommendations for what needs to be done for her care.

## 2023-08-31 NOTE — Telephone Encounter (Signed)
I spoke with the patients daughter and gave her some resources and facilities to reach out to.

## 2023-09-28 ENCOUNTER — Telehealth: Payer: Self-pay | Admitting: Family Medicine

## 2023-09-28 NOTE — Telephone Encounter (Signed)
Patient dropped off document FL2, to be filled out by provider. Patient requested to send it back via Call Patient to pick up within 5-days. Document is located in providers tray at front office.Please advise at Mobile [phone] (mobile)   Pt dropped off paperwork. I put in the dr box

## 2023-09-29 DIAGNOSIS — Z0279 Encounter for issue of other medical certificate: Secondary | ICD-10-CM

## 2023-09-29 NOTE — Telephone Encounter (Signed)
Form placed in providers room for signature.  Was advised that no appointment was neccessary. Dm/cma

## 2023-10-15 ENCOUNTER — Telehealth: Payer: Self-pay | Admitting: Family Medicine

## 2023-10-15 NOTE — Telephone Encounter (Signed)
[facility] is needing an Ok and acknowledgement from Dr Doreene Burke to use a different manufacture to get her phenytoin (DILANTIN) 100 MG ER capsule [[id number]] filled. She is  almost out of this script.    [facility] - [place], [place] - [address] [place] [address] Phone: [phone]  Fax: [phone]

## 2023-10-27 ENCOUNTER — Other Ambulatory Visit: Payer: Self-pay

## 2023-10-27 ENCOUNTER — Emergency Department (HOSPITAL_COMMUNITY)
Admission: EM | Admit: 2023-10-27 | Discharge: 2023-10-28 | Disposition: A | Discharge status: Home / Self Care | Payer: Medicare Other | Attending: Emergency Medicine | Admitting: Emergency Medicine

## 2023-10-27 ENCOUNTER — Emergency Department (HOSPITAL_COMMUNITY): Payer: Medicare Other

## 2023-10-27 DIAGNOSIS — W19XXXA Unspecified fall, initial encounter: Secondary | ICD-10-CM

## 2023-10-27 DIAGNOSIS — W1830XA Fall on same level, unspecified, initial encounter: Secondary | ICD-10-CM | POA: Diagnosis not present

## 2023-10-27 DIAGNOSIS — M25522 Pain in left elbow: Secondary | ICD-10-CM | POA: Diagnosis present

## 2023-10-27 DIAGNOSIS — S50312A Abrasion of left elbow, initial encounter: Secondary | ICD-10-CM | POA: Insufficient documentation

## 2023-10-27 DIAGNOSIS — M25552 Pain in left hip: Secondary | ICD-10-CM | POA: Insufficient documentation

## 2023-10-27 DIAGNOSIS — F0390 Unspecified dementia without behavioral disturbance: Secondary | ICD-10-CM | POA: Insufficient documentation

## 2023-10-27 DIAGNOSIS — Y92009 Unspecified place in unspecified non-institutional (private) residence as the place of occurrence of the external cause: Secondary | ICD-10-CM | POA: Diagnosis not present

## 2023-10-27 LAB — CBC WITH DIFFERENTIAL/PLATELET
Abs Immature Granulocytes: 0.01 10*3/uL (ref 0.00–0.07)
Basophils Absolute: 0 10*3/uL (ref 0.0–0.1)
Basophils Relative: 1 %
Eosinophils Absolute: 0.1 10*3/uL (ref 0.0–0.5)
Eosinophils Relative: 2 %
HCT: 39.2 % (ref 36.0–46.0)
Hemoglobin: 12.6 g/dL (ref 12.0–15.0)
Immature Granulocytes: 0 %
Lymphocytes Relative: 40 %
Lymphs Abs: 2.2 10*3/uL (ref 0.7–4.0)
MCH: 31.5 pg (ref 26.0–34.0)
MCHC: 32.1 g/dL (ref 30.0–36.0)
MCV: 98 fL (ref 80.0–100.0)
Monocytes Absolute: 0.7 10*3/uL (ref 0.1–1.0)
Monocytes Relative: 13 %
Neutro Abs: 2.5 10*3/uL (ref 1.7–7.7)
Neutrophils Relative %: 44 %
Platelets: 203 10*3/uL (ref 150–400)
RBC: 4 MIL/uL (ref 3.87–5.11)
RDW: 14.1 % (ref 11.5–15.5)
WBC: 5.5 10*3/uL (ref 4.0–10.5)
nRBC: 0 % (ref 0.0–0.2)

## 2023-10-27 LAB — COMPREHENSIVE METABOLIC PANEL
ALT: 22 U/L (ref 0–44)
AST: 31 U/L (ref 15–41)
Albumin: 3 g/dL — ABNORMAL LOW (ref 3.5–5.0)
Alkaline Phosphatase: 132 U/L — ABNORMAL HIGH (ref 38–126)
Anion gap: 11 (ref 5–15)
BUN: 13 mg/dL (ref 8–23)
CO2: 23 mmol/L (ref 22–32)
Calcium: 8.6 mg/dL — ABNORMAL LOW (ref 8.9–10.3)
Chloride: 101 mmol/L (ref 98–111)
Creatinine, Ser: 0.54 mg/dL (ref 0.44–1.00)
GFR, Estimated: >60 mL/min (ref >60)
Glucose, Bld: 74 mg/dL (ref 70–99)
Potassium: 4.5 mmol/L (ref 3.5–5.1)
Sodium: 135 mmol/L (ref 135–145)
Total Bilirubin: 0.4 mg/dL (ref <1.2)
Total Protein: 7.4 g/dL (ref 6.5–8.1)

## 2023-10-27 LAB — I-STAT CG4 LACTIC ACID, ED
Lactic Acid, Venous: 1.1 mmol/L (ref 0.5–1.9)
Lactic Acid, Venous: 1.3 mmol/L (ref 0.5–1.9)

## 2023-10-27 MED ORDER — ALENDRONATE SODIUM 70 MG PO TABS
70.0000 mg | ORAL_TABLET | ORAL | Status: DC
  Administered 2023-10-27: 70 mg via ORAL
  Filled 2023-10-27: qty 1

## 2023-10-27 MED ORDER — PHENYTOIN SODIUM EXTENDED 100 MG PO CAPS
300.0000 mg | ORAL_CAPSULE | Freq: Every day | ORAL | Status: DC
  Administered 2023-10-27: 300 mg via ORAL
  Filled 2023-10-27: qty 3

## 2023-10-27 MED ORDER — PAROXETINE HCL 20 MG PO TABS
20.0000 mg | ORAL_TABLET | Freq: Every day | ORAL | Status: DC
  Administered 2023-10-27: 20 mg via ORAL
  Filled 2023-10-27 (×3): qty 1

## 2023-10-27 MED ORDER — ACETAMINOPHEN 500 MG PO TABS: 1000.0000 mg | ORAL_TABLET | Freq: Four times a day (QID) | ORAL | Status: DC | PRN

## 2023-10-27 MED ORDER — DONEPEZIL HCL 10 MG PO TABS
10.0000 mg | ORAL_TABLET | Freq: Every day | ORAL | Status: DC
  Administered 2023-10-27: 10 mg via ORAL
  Filled 2023-10-27: qty 1

## 2023-10-27 NOTE — ED Triage Notes (Addendum)
Daughter stated, she fell at home on Sunday , she went to bathroom without her rollie and I found her on the floor naked. She complains of left hip pain and her left  elbow pain. She goes to [facility]. She wants to stay in bed all the time and she's eating and drinking since she fell. When she does weight bearing she screams.

## 2023-10-27 NOTE — TOC Initial Note (Signed)
Transition of Care [facility]) - Initial/Assessment Note    Patient Details  Name: Gabrielle Berry MRN: [id number] Date of Birth: April 05, 1951  [facility]) CM/SW Contact:    Susa Simmonds, LCSWA Phone Number: 10/27/2023, 7:26 PM  Clinical Narrative: CSW spoke with patients daughter Gabrielle Berry who agrees with the SNF recommendations. Patients daughter stated she applied for medicaid on October 14th. Patients daughter is interested in long term care if patient qualifies through [place]. Patients daughter stated if patient doesn't qualify she will take patient home. CSW explained the SNF wavier to patients daughter due to patient having medicare A/B. Patients daughter voiced understanding. CSW will start SNF bed search.               Expected Discharge Plan: Skilled Nursing Facility Barriers to Discharge: Continued Medical Work up   Patient Goals and CMS Choice Patient states their goals for this hospitalization and ongoing recovery are:: SNF          Expected Discharge Plan and Services       Living arrangements for the past 2 months: Single Family Home                                      Prior Living Arrangements/Services Living arrangements for the past 2 months: Single Family Home Lives with:: Adult Children Patient language and need for interpreter reviewed:: Yes Do you feel safe going back to the place where you live?: Yes      Need for Family Participation in Patient Care: Yes (Comment) Care giver support system in place?: Yes (comment)   Criminal Activity/Legal Involvement Pertinent to Current Situation/Hospitalization: No - Comment as needed  Activities of Daily Living      Permission Sought/Granted                  Emotional Assessment Appearance:: Appears stated age     Orientation: : Oriented to Self Alcohol / Substance Use: Not Applicable Psych Involvement: No (comment)  Admission diagnosis:  FALL HIP PAIN Patient Active  Problem List   Diagnosis Date Noted   Medication side effect 09/02/2022   Elevated BP without diagnosis of hypertension 09/02/2022   Debilitated 08/04/2022   Chronic obstructive pulmonary disease (HCC) 04/23/2021   Chronic pain syndrome 04/15/2021   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   Skin sensation disturbance 04/15/2021   Tremor 04/15/2021   Unspecified severe protein-calorie malnutrition (HCC) 04/15/2021   Weight loss 04/15/2021   Healthcare maintenance 04/15/2021   Cough 04/15/2021   Senile osteoporosis 10/04/2020   Osteoporotic fracture of left hip (HCC) 10/04/2020   Seizure (HCC) 05/18/2020   Alzheimer's dementia with behavioral disturbance (HCC) 05/18/2020   Anxiety 05/18/2020   History of Guillain-Barre syndrome 05/18/2020   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 [place] [address] Phone: [phone] Fax: [phone]  [id number] - [place], [place] - [address] [address] Phone: [phone] Fax: [phone]     Social Determinants of Health (SDOH) Social History: SDOH Screenings   Food Insecurity: No Food Insecurity (11/25/2022)  Housing: Low Risk  (11/12/2021)  Transportation Needs: No Transportation Needs (11/25/2022)  Alcohol Screen: Low Risk  (11/12/2021)  Depression (PHQ2-9): Low Risk  (07/21/2023)  Financial Resource Strain: Low Risk  (  11/25/2022)  Physical Activity: Inactive (11/25/2022)  Social Connections: Socially Isolated (11/12/2021)  Stress: No Stress Concern Present (11/25/2022)  Tobacco Use: Low Risk  (08/10/2023)   SDOH Interventions:     Readmission Risk Interventions     No data to display

## 2023-10-27 NOTE — [place] FL2 (Signed)
[place] MEDICAID FL2 LEVEL OF CARE FORM     IDENTIFICATION  Patient Name: Gabrielle Berry Birthdate: [birth year] Sex: female Admission Date (Current Location): 10/27/2023  [facility] and [place] Number:  Producer, television/film/video and Address:  [facility], [address], [place], [place] [address]      Provider Number: [id number]  Attending Physician Name and Address:  System, Provider Not In  Relative Name and Phone Number:  [name] (Daughter)  [phone]    Current Level of Care: Hospital Recommended Level of Care: Skilled Nursing Facility Prior Approval Number:    Date Approved/Denied:   PASRR Number: [id number]  Discharge Plan: SNF    Current Diagnoses: 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   Skin sensation disturbance 04/15/2021   Tremor 04/15/2021   Unspecified severe protein-calorie malnutrition (HCC) 04/15/2021   Weight loss 04/15/2021   Healthcare maintenance 04/15/2021   Cough 04/15/2021   Senile osteoporosis 10/04/2020   Osteoporotic fracture of left hip (HCC) 10/04/2020   Seizure (HCC) 05/18/2020   Alzheimer's dementia with behavioral disturbance (HCC) 05/18/2020   Anxiety 05/18/2020   History of Guillain-Barre syndrome 05/18/2020   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    Orientation RESPIRATION BLADDER Height & Weight     Self  Normal Continent Weight:   Height:  5\' 3"  (160 cm)  BEHAVIORAL SYMPTOMS/MOOD NEUROLOGICAL BOWEL NUTRITION STATUS      Continent Diet (Regular)  AMBULATORY STATUS COMMUNICATION OF NEEDS Skin   Extensive Assist Verbally Normal                       Personal Care Assistance Level of Assistance  Bathing, Feeding,  Dressing Bathing Assistance: Limited assistance Feeding assistance: Independent Dressing Assistance: Limited assistance     Functional Limitations Info  Sight, Hearing, Speech Sight Info: Adequate Hearing Info: Adequate Speech Info: Adequate    SPECIAL CARE FACTORS FREQUENCY                       Contractures Contractures Info: Not present    Additional Factors Info  Code Status, Allergies Code Status Info: Not on File Allergies Info: Haemophilus Influenzae Vaccines , Penicillins           Current Medications (10/27/2023):  This is the current hospital active medication list Current Facility-Administered Medications  Medication Dose Route Frequency Provider Last Rate Last Admin   acetaminophen (TYLENOL) tablet 1,000 mg  1,000 mg Oral Q6H PRN Ernie Avena, MD       alendronate (FOSAMAX) tablet 70 mg  70 mg Oral Q7 days Ernie Avena, MD   70 mg at 10/27/23 1625   donepezil (ARICEPT) tablet 10 mg  10 mg Oral QHS Ernie Avena, MD       PARoxetine (PAXIL) tablet 20 mg  20 mg Oral QHS Ernie Avena, MD       phenytoin (DILANTIN) ER capsule 300 mg  300 mg Oral QHS Ernie Avena, MD       Current Outpatient Medications  Medication Sig Dispense Refill   acetaminophen (TYLENOL) 500 MG tablet Take 1,000 mg by mouth every 6 (six) hours as needed for mild pain (pain score 1-3) or moderate pain (pain score 4-6).  calcium-vitamin D (OSCAL-500) 500-400 MG-UNIT tablet Take 1 tablet by mouth 2 (two) times daily. (Patient taking differently: Take 1 tablet by mouth at bedtime.) 180 tablet 1   donepezil (ARICEPT ODT) 10 MG disintegrating tablet Take 1 tablet (10 mg total) by mouth at bedtime. 90 tablet 1   Multiple Vitamin (MULTIVITAMIN ADULT PO) Take 1 tablet by mouth at bedtime.     PARoxetine (PAXIL) 20 MG tablet Take 1 tablet (20 mg total) by mouth daily. (Patient taking differently: Take 20 mg by mouth at bedtime.) 90 tablet 1   phenytoin (DILANTIN) 100 MG ER capsule Take  3 capsules every night (Patient taking differently: Take 300 mg by mouth at bedtime. Take 3 capsules every night) 270 capsule 3   alendronate (FOSAMAX) 70 MG tablet Take 1 tablet (70 mg total) by mouth every 7 (seven) days. Take with a full glass of water on an empty stomach. (Patient not taking: Reported on 10/27/2023) 12 tablet 3     Discharge Medications: Please see discharge summary for a list of discharge medications.  Relevant Imaging Results:  Relevant Lab Results:   Additional Information SSN:  Susa Simmonds, LCSWA

## 2023-10-27 NOTE — ED Notes (Signed)
Per lab, CMP hemolyzed, phlebotomy at bedside for a recollect.

## 2023-10-27 NOTE — ED Notes (Signed)
Pt provided with food and something to drink. Pt given warm blankets and is up eating.

## 2023-10-27 NOTE — Progress Notes (Signed)
PT consult pending 

## 2023-10-27 NOTE — ED Provider Notes (Signed)
[place] EMERGENCY DEPARTMENT AT [facility] Provider Note   CSN: [id number] Arrival date & time: 10/27/23  4696     History  Chief Complaint  Patient presents with   Fall   Hip Pain   not eating   Insomnia    Gabrielle Berry is a 72 y.o. female.   Fall  Hip Pain  Insomnia     60 female with medical history significant for Guillain-Barr syndrome, family reports also history of Lewy body dementia who presents to the emergency department after a fall at home on Sunday.  The patient went to the bathroom without her roller walker and was found on the floor naked in the bathroom, shortly after her fall, no loss of consciousness, unclear head trauma.  The patient is incontinent of urine and stool at baseline and appears to be at her baseline mental status.  Her daughter states that she is having increasing difficulty managing her care at home.  She brought her to the emergency department for evaluation because she was complaining of pain in her left hip and had been having significant pain on attempts at weightbearing in her left hip.  Remainder of HPI is limited by the patient's dementia.  Home Medications Prior to Admission medications   Medication Sig Start Date End Date Taking? Authorizing Provider  acetaminophen (TYLENOL) 500 MG tablet Take 1,000 mg by mouth every 6 (six) hours as needed for mild pain (pain score 1-3) or moderate pain (pain score 4-6).   Yes [provider]  calcium-vitamin D (OSCAL-500) 500-400 MG-UNIT tablet Take 1 tablet by mouth 2 (two) times daily. Patient taking differently: Take 1 tablet by mouth at bedtime. 10/04/20  Yes [name], MD  donepezil (ARICEPT ODT) 10 MG disintegrating tablet Take 1 tablet (10 mg total) by mouth at bedtime. 04/14/23  Yes [name], MD  Multiple Vitamin (MULTIVITAMIN ADULT PO) Take 1 tablet by mouth at bedtime.   Yes [provider]  PARoxetine (PAXIL) 20 MG tablet Take 1  tablet (20 mg total) by mouth daily. Patient taking differently: Take 20 mg by mouth at bedtime. 04/14/23  Yes [name], MD  phenytoin (DILANTIN) 100 MG ER capsule Take 3 capsules every night Patient taking differently: Take 300 mg by mouth at bedtime. Take 3 capsules every night 04/14/23  Yes [name], MD  alendronate (FOSAMAX) 70 MG tablet Take 1 tablet (70 mg total) by mouth every 7 (seven) days. Take with a full glass of water on an empty stomach. Patient not taking: Reported on 10/27/2023 07/30/23   [name], MD      Allergies    Haemophilus influenzae vaccines and Penicillins    Review of Systems   Review of Systems  Unable to perform ROS: Dementia  Psychiatric/Behavioral:  The patient has insomnia.     Physical Exam Updated Vital Signs BP 130/67   Pulse 78   Temp 98.3 F (36.8 C) (Oral)   Resp 17   Ht 5\' 3"  (1.6 m)   LMP  (LMP Unknown)   SpO2 100%   BMI 13.92 kg/m  Physical Exam Vitals and nursing note reviewed.  Constitutional:      General: She is not in acute distress.    Appearance: She is well-developed.     Comments: GCS 14, ABC intact  HENT:     Head: Normocephalic and atraumatic.  Eyes:     Extraocular Movements: Extraocular movements intact.     Conjunctiva/sclera:  Conjunctivae normal.     Pupils: Pupils are equal, round, and reactive to light.  Neck:     Comments: No midline tenderness to palpation of the cervical spine.  Range of motion intact Cardiovascular:     Rate and Rhythm: Normal rate and regular rhythm.  Pulmonary:     Effort: Pulmonary effort is normal. No respiratory distress.     Breath sounds: Normal breath sounds.  Chest:     Comments: Clavicles stable nontender to AP compression.  Chest wall stable and nontender to AP and lateral compression. Abdominal:     Palpations: Abdomen is soft.     Tenderness: There is no abdominal tenderness.     Comments: Pelvis stable to lateral compression, mild  tenderness about the left hip  Musculoskeletal:     Cervical back: Neck supple.     Comments: No midline tenderness to palpation of the thoracic or lumbar spine.  Extremities atraumatic with intact range of motion, mild tenderness about the left hip, intact range of motion, Abrasion to left elbow.  Skin:    General: Skin is warm and dry.  Neurological:     Mental Status: She is alert.     Comments: Cranial nerves II through XII grossly intact.  Moving all 4 extremities spontaneously.  Sensation grossly intact all 4 extremities, 5/5 strength all 4 extremities.     ED Results / Procedures / Treatments   Labs (all labs ordered are listed, but only abnormal results are displayed) Labs Reviewed  COMPREHENSIVE METABOLIC PANEL - Abnormal; Notable for the following components:      Result Value   Calcium 8.6 (*)    Albumin 3.0 (*)    Alkaline Phosphatase 132 (*)    All other components within normal limits  CBC WITH DIFFERENTIAL/PLATELET  I-STAT CG4 LACTIC ACID, ED  I-STAT CG4 LACTIC ACID, ED    EKG EKG Interpretation Date/Time:  Tuesday October 27 2023 11:39:57 EST Ventricular Rate:  81 PR Interval:  146 QRS Duration:  81 QT Interval:  375 QTC Calculation: 436 R Axis:   62  Text Interpretation: Sinus rhythm Right atrial enlargement Confirmed by Ernie Avena ([id number]) on 10/27/2023 12:32:07 PM  Radiology CT Hip Left Wo Contrast  Result Date: 10/27/2023 CLINICAL DATA:  Hip trauma, fracture suspected, xray done EXAM: CT OF THE LEFT HIP WITHOUT CONTRAST TECHNIQUE: Multidetector CT imaging of the left hip was performed according to the standard protocol. Multiplanar CT image reconstructions were also generated. RADIATION DOSE REDUCTION: This exam was performed according to the departmental dose-optimization program which includes automated exposure control, adjustment of the mA and/or kV according to patient size and/or use of iterative reconstruction technique. COMPARISON:  None  Available. FINDINGS: Bones/Joint/Cartilage There is diffuse osteopenia of the visualized osseous structures. There is no acute fracture or dislocation. There are moderate-to-severe degenerative changes of left hip joint. Ligaments Suboptimally assessed by CT. Muscles and Tendons Within normal limits.  No discrete intramuscular hematoma seen. Soft tissues There are scattered colonic diverticula without diverticulitis. No bowel obstruction. There is small amount of air in the nondependent portion of the urinary bladder, likely secondary to instrumentation. Otherwise unremarkable urinary bladder.There is left extrarenal pelvis, partially imaged. IMPRESSION: *Osteoporosis.  No acute fracture of the pelvis or left hip joint. Electronically Signed   By: Jules Schick M.D.   On: 10/27/2023 14:41   DG Hip Unilat With Pelvis 2-3 Views Left  Result Date: 10/27/2023 CLINICAL DATA:  Fall.  Left hip pain. EXAM: DG HIP (WITH  OR WITHOUT PELVIS) 2-3V LEFT COMPARISON:  None Available. FINDINGS: Pelvis is intact with normal and symmetric sacroiliac joints. No acute fracture or dislocation. No aggressive osseous lesion. Metallic hardware noted in the right proximal femur. Visualized sacral arcuate lines are unremarkable. Unremarkable symphysis pubis. Mild degenerative changes of right hip joint and moderate-to-severe degenerative changes of left hip joint. No radiopaque foreign bodies. IMPRESSION: *No acute osseous abnormality of the pelvis or left hip. Electronically Signed   By: Jules Schick M.D.   On: 10/27/2023 14:33   DG Chest Portable 1 View  Result Date: 10/27/2023 CLINICAL DATA:  Fall.  Found down. EXAM: PORTABLE CHEST 1 VIEW COMPARISON:  Chest radiograph 04/18/2021. FINDINGS: Clear lungs. Stable cardiac and mediastinal contours. No pleural effusion or pneumothorax. Visualized bones and upper abdomen are unremarkable. IMPRESSION: No evidence of acute cardiopulmonary disease. Electronically Signed   By: Orvan Falconer M.D.   On: 10/27/2023 12:15   CT HEAD WO CONTRAST ( )  Result Date: 10/27/2023 CLINICAL DATA:  Head trauma, minor (Age >= 65y).  Unwitnessed fall. EXAM: CT HEAD WITHOUT CONTRAST TECHNIQUE: Contiguous axial images were obtained from the base of the skull through the vertex without intravenous contrast. RADIATION DOSE REDUCTION: This exam was performed according to the departmental dose-optimization program which includes automated exposure control, adjustment of the mA and/or kV according to patient size and/or use of iterative reconstruction technique. COMPARISON:  Head CT 01/14/2021. FINDINGS: Brain: No acute hemorrhage. Stable mild chronic small-vessel disease. Unchanged dystrophic calcification in the central pons. Cortical gray-white differentiation is otherwise preserved. No hydrocephalus or extra-axial collection. No mass effect or midline shift. Vascular: No hyperdense vessel or unexpected calcification. Skull: No calvarial fracture or suspicious bone lesion. Skull base is unremarkable. Sinuses/Orbits: No acute finding. Other: None. IMPRESSION: 1. No evidence of acute intracranial injury. 2. Stable mild chronic small-vessel disease. 3. Unchanged dystrophic calcification in the central pons. Electronically Signed   By: Orvan Falconer M.D.   On: 10/27/2023 12:15    Procedures Procedures    Medications Ordered in ED Medications - No data to display  ED Course/ Medical Decision Making/ A&P                                 Medical Decision Making Amount and/or Complexity of Data Reviewed Labs: ordered. Radiology: ordered.   28 female with medical history significant for Guillain-Barr syndrome, family reports also history of Lewy body dementia who presents to the emergency department after a fall at home on Sunday.  The patient went to the bathroom without her roller walker and was found on the floor naked in the bathroom, shortly after her fall, no loss of consciousness, unclear  head trauma.  The patient is incontinent of urine and stool at baseline and appears to be at her baseline mental status.  Her daughter states that she is having increasing difficulty managing her care at home.  She brought her to the emergency department for evaluation because she was complaining of pain in her left hip and had been having significant pain on attempts at weightbearing in her left hip.  Remainder of HPI is limited by the patient's dementia.  On arrival, the patient was vitally stable, afebrile, saturating well on room air, not tachypneic on my evaluation, hemodynamically stable.  Sinus rhythm noted on cardiac telemetry.  Physical exam revealed a neurologic exam at the patient's baseline.  Will obtain trauma workup given the patient's  fall.    CT Head: IMPRESSION:  1. No evidence of acute intracranial injury.  2. Stable mild chronic small-vessel disease.  3. Unchanged dystrophic calcification in the central pons.    CXR: IMPRESSION:  No evidence of acute cardiopulmonary disease.    Pelvic XR: IMPRESSION:  *No acute osseous abnormality of the pelvis or left hip.    CT Left Hip: IMPRESSION:  *Osteoporosis.  No acute fracture of the pelvis or left hip joint.    Labs: CBC without a leukocytosis or anemia, CMP without significant electrolyte abnormality, lactic acid normal.  Urinalysis ordered in triage but unable to be collected.  Patient at her baseline, no complaint of urinary symptoms.  Will cancel urinalysis.  Family member state that they are unable to care for the patient in the home anymore. Pt at baseline mental status for her dementia per family. Will make a TOC boarder and consult PT.    Final Clinical Impression(s) / ED Diagnoses Final diagnoses:  Fall, initial encounter  Dementia, unspecified dementia severity, unspecified dementia type, unspecified whether behavioral, psychotic, or mood disturbance or anxiety [facility])    Rx / DC Orders ED Discharge Orders      None         Ernie Avena, MD 10/27/23 1501

## 2023-10-27 NOTE — Evaluation (Signed)
Physical Therapy Evaluation Patient Details Name: Gabrielle Berry MRN: [id number] DOB: 11-02-1951 Today's Date: 10/27/2023  History of Present Illness  72 y.o. female presents to [facility] hospital on 12/26/2022 after a recent fall at home. Pt's daughter reports having increasing difficulty caring for the patient at home. PMH includes GBS, Lewy body dementia, seizure, anxiety.  Clinical Impression  Pt presents to PT with significant deficits in cognition, awareness, balance, strength, power. Pt has a history of multiple falls and no recollection of falls or deficits. Pt demonstrates a posterior lean in standing and requires PT assistance to maintain stability when upright. Pt remains at a high risk for falls. Pt's daughter feels she is unable to care for the patient adequately enough due to high falls risk. Patient will benefit from continued inpatient follow up therapy, <3 hours/day.         If plan is discharge home, recommend the following: A lot of help with walking and/or transfers;A lot of help with bathing/dressing/bathroom;Assistance with cooking/housework;Assistance with feeding;Direct supervision/assist for medications management;Direct supervision/assist for financial management;Assist for transportation;Help with stairs or ramp for entrance;Supervision due to cognitive status   Can travel by private vehicle   No    Equipment Recommendations None recommended by PT  Recommendations for Other Services       Functional Status Assessment Patient has had a recent decline in their functional status and demonstrates the ability to make significant improvements in function in a reasonable and predictable amount of time.     Precautions / Restrictions Precautions Precautions: Fall Precaution Comments: history of multiple falls, recent fall sunday Restrictions Weight Bearing Restrictions: No      Mobility  Bed Mobility Overal bed mobility: Needs Assistance Bed Mobility: Supine to Sit, Sit  to Supine     Supine to sit: Min assist Sit to supine: Min assist        Transfers Overall transfer level: Needs assistance Equipment used: 1 person hand held assist Transfers: Sit to/from Stand Sit to Stand: Min assist           General transfer comment: posterior lean    Ambulation/Gait Ambulation/Gait assistance: Mod assist Gait Distance (Feet): 2 Feet Assistive device: 2 person hand held assist   Gait velocity: reduced Gait velocity interpretation: <1.31 ft/sec, indicative of household ambulator   General Gait Details: strong posterior lean with attempts at gait initiation  Stairs            Wheelchair Mobility     Tilt Bed    Modified Rankin (Stroke Patients Only)       Balance Overall balance assessment: Needs assistance Sitting-balance support: No upper extremity supported, Feet supported       Standing balance support: Bilateral upper extremity supported, Reliant on assistive device for balance Standing balance-Leahy Scale: Poor                               Pertinent Vitals/Pain Pain Assessment Pain Assessment: PAINAD Breathing: normal Negative Vocalization: occasional moan/groan, low speech, negative/disapproving quality Facial Expression: smiling or inexpressive Body Language: tense, distressed pacing, fidgeting Consolability: distracted or reassured by voice/touch PAINAD Score: 3 Pain Intervention(s): Limited activity within patient's tolerance    Home Living Family/patient expects to be discharged to:: Private residence Living Arrangements: Children;Other relatives Available Help at Discharge: Family;Available 24 hours/day Type of Home: House Home Access: Level entry       Home Layout: One level Home Equipment: Rollator (4 wheels);Wheelchair -  manual;Other (comment);Shower seat (hoyer lift)      Prior Function Prior Level of Function : Needs assist             Mobility Comments: pt has required  assistance to transfer from bed to wheelchair in recent weeks, totalA for wheelchair mobility. Pt has had multiple falls when attempting to get up unassisted ADLs Comments: assist for all ADLs     Extremity/Trunk Assessment   Upper Extremity Assessment Upper Extremity Assessment: Generalized weakness    Lower Extremity Assessment Lower Extremity Assessment: Generalized weakness    Cervical / Trunk Assessment Cervical / Trunk Assessment: Kyphotic  Communication   Communication Communication: No apparent difficulties Cueing Techniques: Verbal cues  Cognition Arousal: Alert Behavior During Therapy: Impulsive Overall Cognitive Status: History of cognitive impairments - at baseline                                 General Comments: pt with advanced dementia, follows commands with increased time. Pt is unable to report her name this session. Pt has poor awareness of situation and deficits.        General Comments General comments (skin integrity, edema, etc.): VSS on RA    Exercises     Assessment/Plan    PT Assessment Patient needs continued PT services  PT Problem List Decreased strength;Decreased activity tolerance;Decreased balance;Decreased mobility;Decreased cognition;Decreased knowledge of use of DME;Decreased safety awareness;Decreased knowledge of precautions;Pain       PT Treatment Interventions DME instruction;Functional mobility training;Therapeutic activities;Therapeutic exercise;Balance training;Neuromuscular re-education;Patient/family education;Cognitive remediation;Wheelchair mobility training    PT Goals (Current goals can be found in the Care Plan section)  Acute Rehab PT Goals Patient Stated Goal: to reduce falls risk, increase caregiver support PT Goal Formulation: With family Time For Goal Achievement: 11/10/23 Potential to Achieve Goals: Fair    Frequency Min 1X/week     Co-evaluation               AM-PAC PT "6 Clicks"  Mobility  Outcome Measure Help needed turning from your back to your side while in a flat bed without using bedrails?: A Little Help needed moving from lying on your back to sitting on the side of a flat bed without using bedrails?: A Little Help needed moving to and from a bed to a chair (including a wheelchair)?: A Lot Help needed standing up from a chair using your arms (e.g., wheelchair or bedside chair)?: A Little Help needed to walk in hospital room?: Total Help needed climbing 3-5 steps with a railing? : Total 6 Click Score: 13    End of Session Equipment Utilized During Treatment: Gait belt Activity Tolerance: Patient tolerated treatment well Patient left: in bed;with call bell/phone within reach;with family/visitor present Nurse Communication: Mobility status PT Visit Diagnosis: Other abnormalities of gait and mobility (R26.89);Difficulty in walking, not elsewhere classified (R26.2);History of falling (Z91.81)    Time: 2725-3664 PT Time Calculation (min) (ACUTE ONLY): 21 min   Charges:   PT Evaluation $PT Eval Low Complexity: 1 Low   PT General Charges $$ ACUTE PT VISIT: 1 Visit         Arlyss Gandy, PT, DPT Acute Rehabilitation Office [phone]   Arlyss Gandy 10/27/2023, 4:07 PM

## 2023-10-27 NOTE — ED Notes (Signed)
Pt was stuck twice. Wasn't successful.

## 2023-10-27 NOTE — ED Notes (Signed)
Karene Fry, MD notified that we in and out cathed the patient and only collected 2 drops of urine, not enough for a specimen.

## 2023-10-27 NOTE — ED Notes (Signed)
Patient is resting comfortably. 

## 2023-10-28 ENCOUNTER — Encounter (HOSPITAL_COMMUNITY): Payer: Self-pay

## 2023-10-28 NOTE — ED Provider Notes (Signed)
Emergency Medicine Observation Re-evaluation Note  Gabrielle Berry is a 72 y.o. female, seen on rounds today.  Pt initially presented to the ED for complaints of Fall, Hip Pain, not eating, and Insomnia Currently, the patient is resting comfortably.  Physical Exam  BP 121/85   Pulse 74   Temp 98.9 F (37.2 C) (Oral)   Resp 19   Ht 5\' 3"  (1.6 m)   LMP  (LMP Unknown)   SpO2 98%   BMI 13.92 kg/m  Physical Exam General: No acute distress Cardiac: Regular rate and rhythm Lungs: Clear to auscultation   ED Course / MDM  EKG:EKG Interpretation Date/Time:  Tuesday October 27 2023 11:39:57 EST Ventricular Rate:  81 PR Interval:  146 QRS Duration:  81 QT Interval:  375 QTC Calculation: 436 R Axis:   62  Text Interpretation: Sinus rhythm Right atrial enlargement Confirmed by Ernie Avena ([id number]) on 10/27/2023 12:32:07 PM  I have reviewed the labs performed to date as well as medications administered while in observation.  Recent changes in the last 24 hours include the patient has been accepted at a skilled nursing facility.  Plan  Current plan is for discharge to skilled nursing facility today.    Durwin Glaze, MD 10/28/23 [phone]

## 2023-10-28 NOTE — Progress Notes (Signed)
No SNF bed offers at this time.  

## 2023-10-28 NOTE — ED Notes (Signed)
Daughter informed of pt placement

## 2023-10-28 NOTE — ED Notes (Signed)
Medicated per orders, pt given po fluids, pt denies any other needs, will continue to monitor. Pt made aware that daughter had called to check on her while she was sleeping.

## 2023-10-28 NOTE — ED Notes (Signed)
Ptar set up for pt at this time

## 2023-10-28 NOTE — ED Notes (Signed)
Assumed care of patient, NAD noted, pt resting, denies any need at this time.

## 2023-10-28 NOTE — ED Notes (Signed)
Report given to [facility] at this time to linda jones DON

## 2023-10-28 NOTE — ED Notes (Signed)
 NAD noted at this time, pt resting in bed, respirations even and unlabored, skin warm and dry, bed in low position, call light within reach. Pt sleeping. Will continue to monitor.

## 2023-10-28 NOTE — ED Notes (Signed)
Pt sleeping, no changes noted.

## 2023-10-28 NOTE — ED Notes (Signed)
Patient is resting comfortably. 

## 2023-10-28 NOTE — ED Notes (Signed)
Pt awake, cleaned and linen changed, pt repeating questions about Beth and where she is. Pt is confused but pleasant, follows commands.

## 2023-10-28 NOTE — ED Notes (Signed)
No change in pt condition, pt is sleeping at this time, respirations even and unlabored, skin warm and dry.

## 2023-10-28 NOTE — Discharge Instructions (Signed)
Please go to the skilled nursing facility upon discharge

## 2023-10-28 NOTE — Progress Notes (Signed)
CSW spoke with Gabrielle Berry in admissions at [facility] who extended a bed offer to patient. CSW contacted patients daughter [name] who agrees with plan. Patient will be going to room 35. The number for report is [phone]. CSW notified EDP and RN.

## 2023-10-28 NOTE — ED Notes (Signed)
Daughter given update at this time via telephone call

## 2023-10-29 ENCOUNTER — Other Ambulatory Visit: Payer: Self-pay | Admitting: *Deleted

## 2023-10-29 NOTE — Patient Outreach (Signed)
Gabrielle Berry admitted to [facility] skilled nursing facility on 10/28/23 under [facility] SNF waiver. Screening for potential chronic care management services as a benefit of health plan and primary care provider.  Collaboration with Eber Jones, SNF social worker. Eber Jones confirms Gabrielle Berry's admission tp SNF yesterday. Made Eber Jones aware Clinical research associate will follow for transition plans and potential care management needs.   Raiford Noble, MSN, RN, BSN [place]  [facility], [facility] RN Post- Acute Care Manager Direct Dial: [phone]

## 2023-10-30 ENCOUNTER — Telehealth: Payer: Self-pay | Admitting: Family Medicine

## 2023-10-30 ENCOUNTER — Ambulatory Visit: Payer: Medicare Other | Admitting: Family Medicine

## 2023-10-30 NOTE — Telephone Encounter (Signed)
Daughter called and said [facility] and they need a copy of the pt vaccines . There fax is [phone] and phone number is [phone]

## 2023-11-16 ENCOUNTER — Encounter: Payer: Self-pay | Admitting: Family Medicine

## 2023-11-20 ENCOUNTER — Other Ambulatory Visit: Payer: Self-pay | Admitting: *Deleted

## 2023-11-20 NOTE — Patient Outreach (Signed)
Post-Acute Care Manager follow up. Per [facility], Mrs. Lucia resides in [facility]. Writer following for [facility] SNF waiver utilization and for potential complex care management needs.   Secure communication sent to [place], SNF social worker to request update on transition plans/needs.   Will continue to follow.   Raiford Noble, MSN, RN, BSN [place]  [facility], [facility] Commun[facility] RN Post- Acute Care Manager Direct Dial: [phone]

## 2023-11-21 ENCOUNTER — Encounter (HOSPITAL_COMMUNITY): Payer: Self-pay | Admitting: *Deleted

## 2023-11-21 ENCOUNTER — Emergency Department (HOSPITAL_COMMUNITY)
Admission: EM | Admit: 2023-11-21 | Discharge: 2023-11-22 | Disposition: A | Discharge status: Home / Self Care | Payer: Medicare Other | Attending: Emergency Medicine | Admitting: Emergency Medicine

## 2023-11-21 ENCOUNTER — Other Ambulatory Visit: Payer: Self-pay

## 2023-11-21 DIAGNOSIS — F0390 Unspecified dementia without behavioral disturbance: Secondary | ICD-10-CM | POA: Insufficient documentation

## 2023-11-21 DIAGNOSIS — R112 Nausea with vomiting, unspecified: Secondary | ICD-10-CM | POA: Diagnosis present

## 2023-11-21 DIAGNOSIS — R1030 Lower abdominal pain, unspecified: Secondary | ICD-10-CM | POA: Insufficient documentation

## 2023-11-21 DIAGNOSIS — R1032 Left lower quadrant pain: Secondary | ICD-10-CM | POA: Diagnosis not present

## 2023-11-21 HISTORY — DX: Unspecified dementia, unspecified severity, without behavioral disturbance, psychotic disturbance, mood disturbance, and anxiety: F03.90

## 2023-11-21 NOTE — ED Provider Notes (Signed)
[place] EMERGENCY DEPARTMENT AT [facility] Provider Note   CSN: [id number] Arrival date & time: 11/21/23  2233     History {Add pertinent medical, surgical, social history, OB history to HPI:1} Chief Complaint  Patient presents with   Emesis    Gabrielle Berry is a 72 y.o. female.  The history is provided by the patient and medical records.  Emesis Associated symptoms: abdominal pain    72 y.o F with hx of dementia, anxiety, osteoporosis, presenting to the ED with abdominal pain, nausea, and vomiting.  Patient from [facility], staff reported she has been complaining of left lower abdominal pain for the past 2 days, not really wanting to eat/drink.  No fever/chills reported.  She reports BM's have been normal and non-bloody.  Has had prior tubal ligation.  Given 500cc bolus and 4mg  zofran en route with improvement in symptoms.  Home Medications Prior to Admission medications   Medication Sig Start Date End Date Taking? Authorizing Provider  acetaminophen (TYLENOL) 500 MG tablet Take 1,000 mg by mouth every 6 (six) hours as needed for mild pain (pain score 1-3) or moderate pain (pain score 4-6).    [provider]  alendronate (FOSAMAX) 70 MG tablet Take 1 tablet (70 mg total) by mouth every 7 (seven) days. Take with a full glass of water on an empty stomach. Patient not taking: Reported on 10/27/2023 07/30/23   [name], MD  calcium-vitamin D (OSCAL-500) 500-400 MG-UNIT tablet Take 1 tablet by mouth 2 (two) times daily. Patient taking differently: Take 1 tablet by mouth at bedtime. 10/04/20   [name], MD  donepezil (ARICEPT ODT) 10 MG disintegrating tablet Take 1 tablet (10 mg total) by mouth at bedtime. 04/14/23   [name], MD  Multiple Vitamin (MULTIVITAMIN ADULT PO) Take 1 tablet by mouth at bedtime.    [provider]  PARoxetine (PAXIL) 20 MG tablet Take 1 tablet (20 mg total) by mouth daily. Patient  taking differently: Take 20 mg by mouth at bedtime. 04/14/23   [name], MD  phenytoin (DILANTIN) 100 MG ER capsule Take 3 capsules every night Patient taking differently: Take 300 mg by mouth at bedtime. Take 3 capsules every night 04/14/23   [name], MD      Allergies    Haemophilus influenzae vaccines and Penicillins    Review of Systems   Review of Systems  Gastrointestinal:  Positive for abdominal pain and vomiting.  All other systems reviewed and are negative.   Physical Exam Updated Vital Signs BP (!) 173/83 (BP Location: Right Arm)   Pulse 71   Temp 97.7 F (36.5 C) (Oral)   Resp 18   Ht 5\' 3"  (1.6 m)   Wt 35.7 kg   LMP  (LMP Unknown)   SpO2 97%   BMI 13.94 kg/m  Physical Exam Vitals and nursing note reviewed.  Constitutional:      Appearance: She is well-developed.     Comments: thin  HENT:     Head: Normocephalic and atraumatic.  Eyes:     Conjunctiva/sclera: Conjunctivae normal.     Pupils: Pupils are equal, round, and reactive to light.  Cardiovascular:     Rate and Rhythm: Normal rate and regular rhythm.     Heart sounds: Normal heart sounds.  Pulmonary:     Effort: Pulmonary effort is normal.     Breath sounds: Normal breath sounds.  Abdominal:     General: Bowel sounds are  normal.     Palpations: Abdomen is soft.     Tenderness: There is abdominal tenderness in the suprapubic area and left lower quadrant.     Comments: Mild tenderness suprapubic and LLQ area, no guarding, normal bowel sounds  Musculoskeletal:        General: Normal range of motion.     Cervical back: Normal range of motion.  Skin:    General: Skin is warm and dry.  Neurological:     Mental Status: She is alert and oriented to person, place, and time.     ED Results / Procedures / Treatments   Labs (all labs ordered are listed, but only abnormal results are displayed) Labs Reviewed  COMPREHENSIVE METABOLIC PANEL  LIPASE, BLOOD  CBC WITH  DIFFERENTIAL/PLATELET  URINALYSIS, ROUTINE W REFLEX MICROSCOPIC    EKG None  Radiology No results found.  Procedures Procedures  {Document cardiac monitor, telemetry assessment procedure when appropriate:1}  Medications Ordered in ED Medications - No data to display  ED Course/ Medical Decision Making/ A&P   {   Click here for ABCD2, HEART and other calculatorsREFRESH Note before signing :1}                              Medical Decision Making Amount and/or Complexity of Data Reviewed Radiology: ordered.   ***  {Document critical care time when appropriate:1} {Document review of labs and clinical decision tools ie heart score, Chads2Vasc2 etc:1}  {Document your independent review of radiology images, and any outside records:1} {Document your discussion with family members, caretakers, and with consultants:1} {Document social determinants of health affecting pt's care:1} {Document your decision making why or why not admission, treatments were needed:1} Final Clinical Impression(s) / ED Diagnoses Final diagnoses:  None    Rx / DC Orders ED Discharge Orders     None

## 2023-11-21 NOTE — ED Triage Notes (Signed)
Pt here via [facility] from [facility] for NV and LLQ pain for several days.  Given 4 mg zofran and 200 ml NS en-route.    BP 176/73 HR 70 O2 96% RA RR 18 ET 37

## 2023-11-21 NOTE — ED Provider Triage Note (Signed)
  Emergency Medicine Provider Triage Evaluation Note  MRN:  [id number]  Arrival date & time: 11/21/23    Medically screening exam initiated at 11:13 PM.   CC:   Emesis   HPI:  Gabrielle Berry is a 72 y.o. year-old female presents to the ED with chief complaint of vomiting.  Uncertain how long she has been sick.  Denies pain.  History provided by patient. ROS:  -As included in HPI PE:   Vitals:   11/21/23 2257  BP: (!) 173/83  Pulse: 71  Resp: 18  Temp: 97.7 F (36.5 C)  SpO2: 97%    Non-toxic appearing No respiratory distress  MDM:   I've ordered labs and imaging in triage to expedite lab/diagnostic workup.  Needs a room  Patient was informed that the remainder of the evaluation will be completed by another provider, this initial triage assessment does not replace that evaluation, and the importance of remaining in the ED until their evaluation is complete.    Roxy Horseman, PA-C 11/21/23 2314

## 2023-11-22 ENCOUNTER — Emergency Department (HOSPITAL_COMMUNITY): Payer: Medicare Other

## 2023-11-22 ENCOUNTER — Encounter (HOSPITAL_COMMUNITY): Payer: Self-pay

## 2023-11-22 DIAGNOSIS — R112 Nausea with vomiting, unspecified: Secondary | ICD-10-CM | POA: Diagnosis not present

## 2023-11-22 LAB — COMPREHENSIVE METABOLIC PANEL
ALT: 23 U/L (ref 0–44)
AST: 24 U/L (ref 15–41)
Albumin: 2.6 g/dL — ABNORMAL LOW (ref 3.5–5.0)
Alkaline Phosphatase: 143 U/L — ABNORMAL HIGH (ref 38–126)
Anion gap: 9 (ref 5–15)
BUN: 20 mg/dL (ref 8–23)
CO2: 24 mmol/L (ref 22–32)
Calcium: 8.5 mg/dL — ABNORMAL LOW (ref 8.9–10.3)
Chloride: 105 mmol/L (ref 98–111)
Creatinine, Ser: 0.62 mg/dL (ref 0.44–1.00)
GFR, Estimated: >60 mL/min (ref >60)
Glucose, Bld: 168 mg/dL — ABNORMAL HIGH (ref 70–99)
Potassium: 3.6 mmol/L (ref 3.5–5.1)
Sodium: 138 mmol/L (ref 135–145)
Total Bilirubin: 0.4 mg/dL (ref <1.2)
Total Protein: 7.8 g/dL (ref 6.5–8.1)

## 2023-11-22 LAB — CBC WITH DIFFERENTIAL/PLATELET
Abs Immature Granulocytes: 0.04 10*3/uL (ref 0.00–0.07)
Basophils Absolute: 0 10*3/uL (ref 0.0–0.1)
Basophils Relative: 0 %
Eosinophils Absolute: 0 10*3/uL (ref 0.0–0.5)
Eosinophils Relative: 0 %
HCT: 36.5 % (ref 36.0–46.0)
Hemoglobin: 12.4 g/dL (ref 12.0–15.0)
Immature Granulocytes: 1 %
Lymphocytes Relative: 7 %
Lymphs Abs: 0.6 10*3/uL — ABNORMAL LOW (ref 0.7–4.0)
MCH: 33.2 pg (ref 26.0–34.0)
MCHC: 34 g/dL (ref 30.0–36.0)
MCV: 97.6 fL (ref 80.0–100.0)
Monocytes Absolute: 0.7 10*3/uL (ref 0.1–1.0)
Monocytes Relative: 9 %
Neutro Abs: 6.6 10*3/uL (ref 1.7–7.7)
Neutrophils Relative %: 83 %
Platelets: 214 10*3/uL (ref 150–400)
RBC: 3.74 MIL/uL — ABNORMAL LOW (ref 3.87–5.11)
RDW: 13.3 % (ref 11.5–15.5)
WBC: 8 10*3/uL (ref 4.0–10.5)
nRBC: 0.3 % — ABNORMAL HIGH (ref 0.0–0.2)

## 2023-11-22 LAB — URINALYSIS, ROUTINE W REFLEX MICROSCOPIC
Bilirubin Urine: NEGATIVE
Glucose, UA: 50 mg/dL — AB
Ketones, ur: 20 mg/dL — AB
Leukocytes,Ua: NEGATIVE
Nitrite: NEGATIVE
Protein, ur: 100 mg/dL — AB
Specific Gravity, Urine: 1.028 (ref 1.005–1.030)
pH: 5 (ref 5.0–8.0)

## 2023-11-22 LAB — LIPASE, BLOOD: Lipase: 27 U/L (ref 11–51)

## 2023-11-22 MED ORDER — IOHEXOL 350 MG/ML SOLN
75.0000 mL | Freq: Once | INTRAVENOUS | Status: AC | PRN
  Administered 2023-11-22: 75 mL via INTRAVENOUS

## 2023-11-22 NOTE — Discharge Instructions (Signed)
Labs and CT today without acute findings. Follow-up with primary care doctor. Return here for new concerns.

## 2023-11-22 NOTE — ED Notes (Signed)
Report to [place] at [facility].

## 2023-11-23 ENCOUNTER — Other Ambulatory Visit: Payer: Self-pay

## 2023-11-23 ENCOUNTER — Emergency Department (HOSPITAL_COMMUNITY)
Admission: EM | Admit: 2023-11-23 | Discharge: 2023-11-23 | Disposition: A | Discharge status: Skilled Nursing Facility | Payer: Medicare Other | Attending: Emergency Medicine | Admitting: Emergency Medicine

## 2023-11-23 ENCOUNTER — Emergency Department (HOSPITAL_COMMUNITY): Payer: Medicare Other

## 2023-11-23 ENCOUNTER — Encounter (HOSPITAL_COMMUNITY): Payer: Self-pay

## 2023-11-23 DIAGNOSIS — F0390 Unspecified dementia without behavioral disturbance: Secondary | ICD-10-CM | POA: Insufficient documentation

## 2023-11-23 DIAGNOSIS — N3000 Acute cystitis without hematuria: Secondary | ICD-10-CM | POA: Diagnosis not present

## 2023-11-23 DIAGNOSIS — R1030 Lower abdominal pain, unspecified: Secondary | ICD-10-CM

## 2023-11-23 DIAGNOSIS — R112 Nausea with vomiting, unspecified: Secondary | ICD-10-CM | POA: Insufficient documentation

## 2023-11-23 LAB — CBC WITH DIFFERENTIAL/PLATELET
Abs Immature Granulocytes: 0.02 10*3/uL (ref 0.00–0.07)
Basophils Absolute: 0 10*3/uL (ref 0.0–0.1)
Basophils Relative: 0 %
Eosinophils Absolute: 0 10*3/uL (ref 0.0–0.5)
Eosinophils Relative: 0 %
HCT: 41.9 % (ref 36.0–46.0)
Hemoglobin: 13.6 g/dL (ref 12.0–15.0)
Immature Granulocytes: 0 %
Lymphocytes Relative: 16 %
Lymphs Abs: 1.1 10*3/uL (ref 0.7–4.0)
MCH: 32.4 pg (ref 26.0–34.0)
MCHC: 32.5 g/dL (ref 30.0–36.0)
MCV: 99.8 fL (ref 80.0–100.0)
Monocytes Absolute: 0.7 10*3/uL (ref 0.1–1.0)
Monocytes Relative: 11 %
Neutro Abs: 4.9 10*3/uL (ref 1.7–7.7)
Neutrophils Relative %: 73 %
Platelets: 174 10*3/uL (ref 150–400)
RBC: 4.2 MIL/uL (ref 3.87–5.11)
RDW: 13.3 % (ref 11.5–15.5)
WBC: 6.7 10*3/uL (ref 4.0–10.5)
nRBC: 0 % (ref 0.0–0.2)

## 2023-11-23 LAB — COMPREHENSIVE METABOLIC PANEL
ALT: 22 U/L (ref 0–44)
AST: 29 U/L (ref 15–41)
Albumin: 2.8 g/dL — ABNORMAL LOW (ref 3.5–5.0)
Alkaline Phosphatase: 140 U/L — ABNORMAL HIGH (ref 38–126)
Anion gap: 8 (ref 5–15)
BUN: 26 mg/dL — ABNORMAL HIGH (ref 8–23)
CO2: 25 mmol/L (ref 22–32)
Calcium: 8.4 mg/dL — ABNORMAL LOW (ref 8.9–10.3)
Chloride: 104 mmol/L (ref 98–111)
Creatinine, Ser: 0.44 mg/dL (ref 0.44–1.00)
GFR, Estimated: >60 mL/min (ref >60)
Glucose, Bld: 107 mg/dL — ABNORMAL HIGH (ref 70–99)
Potassium: 3.8 mmol/L (ref 3.5–5.1)
Sodium: 137 mmol/L (ref 135–145)
Total Bilirubin: 0.8 mg/dL (ref <1.2)
Total Protein: 8.2 g/dL — ABNORMAL HIGH (ref 6.5–8.1)

## 2023-11-23 LAB — URINE CULTURE: Culture: <10000 — AB

## 2023-11-23 LAB — LIPASE, BLOOD: Lipase: 29 U/L (ref 11–51)

## 2023-11-23 LAB — URINALYSIS, ROUTINE W REFLEX MICROSCOPIC
Bilirubin Urine: NEGATIVE
Glucose, UA: NEGATIVE mg/dL
Ketones, ur: 80 mg/dL — AB
Nitrite: POSITIVE — AB
Protein, ur: 100 mg/dL — AB
Specific Gravity, Urine: 1.027 (ref 1.005–1.030)
pH: 5 (ref 5.0–8.0)

## 2023-11-23 MED ORDER — CEFPODOXIME PROXETIL 200 MG PO TABS: 200.0000 mg | ORAL_TABLET | Freq: Two times a day (BID) | ORAL | 0 refills | Status: AC

## 2023-11-23 MED ORDER — SODIUM CHLORIDE 0.9 % IV BOLUS
500.0000 mL | Freq: Once | INTRAVENOUS | Status: AC
  Administered 2023-11-23: 500 mL via INTRAVENOUS

## 2023-11-23 MED ORDER — ONDANSETRON 4 MG PO TBDP: 4.0000 mg | ORAL_TABLET | Freq: Three times a day (TID) | ORAL | 0 refills | Status: DC | PRN

## 2023-11-23 MED ORDER — POLYETHYLENE GLYCOL 3350 17 G PO PACK: 17.0000 g | PACK | Freq: Every day | ORAL | 0 refills | Status: DC

## 2023-11-23 MED ORDER — SODIUM CHLORIDE 0.9 % IV SOLN
1.0000 g | Freq: Once | INTRAVENOUS | Status: AC
  Administered 2023-11-23: 1 g via INTRAVENOUS
  Filled 2023-11-23: qty 10

## 2023-11-23 MED ORDER — ONDANSETRON HCL 4 MG/2ML IJ SOLN
4.0000 mg | Freq: Once | INTRAMUSCULAR | Status: AC
  Administered 2023-11-23: 4 mg via INTRAVENOUS
  Filled 2023-11-23: qty 2

## 2023-11-23 NOTE — ED Provider Notes (Signed)
[place] EMERGENCY DEPARTMENT AT [facility] Provider Note   CSN: [id number] Arrival date & time: 11/23/23  1037     History  Chief Complaint  Patient presents with   Abdominal Pain    Angelrose Fagundo is a 72 y.o. female, history of dementia, who presents to the ED secondary to continued nausea, vomiting, after being seen on a 12/7 PM.  She has continued to vomit, daily several times a day, especially anytime someone touches her stomach.  Nursing staff, the facility [facility], states the patient has lost 4 pounds in the past week, and has been diaphoretic, and has worsening tremors.  Patient repeatedly keeps saying "I am going to die."  Home Medications Prior to Admission medications   Medication Sig Start Date End Date Taking? Authorizing Provider  cefpodoxime (VANTIN) 200 MG tablet Take 1 tablet (200 mg total) by mouth 2 (two) times daily for 7 days. 11/23/23 11/30/23 Yes Merline Perkin L, PA  ondansetron (ZOFRAN-ODT) 4 MG disintegrating tablet Take 1 tablet (4 mg total) by mouth every 8 (eight) hours as needed. 11/23/23  Yes Kiarra Kidd L, PA  polyethylene glycol (MIRALAX) 17 g packet Take 17 g by mouth daily. 11/23/23  Yes Kohana Amble L, PA  acetaminophen (TYLENOL) 500 MG tablet Take 1,000 mg by mouth every 6 (six) hours as needed for mild pain (pain score 1-3) or moderate pain (pain score 4-6).    [provider]  alendronate (FOSAMAX) 70 MG tablet Take 1 tablet (70 mg total) by mouth every 7 (seven) days. Take with a full glass of water on an empty stomach. Patient not taking: Reported on 10/27/2023 07/30/23   [name], MD  calcium-vitamin D (OSCAL-500) 500-400 MG-UNIT tablet Take 1 tablet by mouth 2 (two) times daily. Patient taking differently: Take 1 tablet by mouth at bedtime. 10/04/20   [name], MD  donepezil (ARICEPT ODT) 10 MG disintegrating tablet Take 1 tablet (10 mg total) by mouth at bedtime. 04/14/23    [name], MD  Multiple Vitamin (MULTIVITAMIN ADULT PO) Take 1 tablet by mouth at bedtime.    [provider]  PARoxetine (PAXIL) 20 MG tablet Take 1 tablet (20 mg total) by mouth daily. Patient taking differently: Take 20 mg by mouth at bedtime. 04/14/23   [name], MD  phenytoin (DILANTIN) 100 MG ER capsule Take 3 capsules every night Patient taking differently: Take 300 mg by mouth at bedtime. Take 3 capsules every night 04/14/23   [name], MD      Allergies    Haemophilus influenzae vaccines and Penicillins    Review of Systems   Review of Systems  Gastrointestinal:  Positive for abdominal pain.    Physical Exam Updated Vital Signs BP (!) 144/79   Pulse 82   Temp 98.5 F (36.9 C) (Oral)   Resp 17   Ht 5\' 3"  (1.6 m)   Wt 35.4 kg   LMP  (LMP Unknown)   SpO2 97%   BMI 13.82 kg/m  Physical Exam Vitals and nursing note reviewed.  Constitutional:      General: She is not in acute distress.    Appearance: She is well-developed and underweight.     Comments: Chronically ill appearing  HENT:     Head: Normocephalic and atraumatic.  Eyes:     Conjunctiva/sclera: Conjunctivae normal.  Cardiovascular:     Rate and Rhythm: Normal rate and regular rhythm.     Heart sounds:  No murmur heard. Pulmonary:     Effort: Pulmonary effort is normal. No respiratory distress.     Breath sounds: Normal breath sounds.  Abdominal:     Palpations: Abdomen is soft.     Tenderness: There is generalized abdominal tenderness.  Genitourinary:    Comments: Chaperone present: Nurse Tech. No fecal impaction noted, but soft stool in rectum Musculoskeletal:        General: No swelling.     Cervical back: Neck supple.  Skin:    General: Skin is warm and dry.     Capillary Refill: Capillary refill takes less than 2 seconds.  Neurological:     Mental Status: She is alert.  Psychiatric:        Mood and Affect: Mood normal.     ED Results /  Procedures / Treatments   Labs (all labs ordered are listed, but only abnormal results are displayed) Labs Reviewed  URINALYSIS, ROUTINE W REFLEX MICROSCOPIC - Abnormal; Notable for the following components:      Result Value   Color, Urine AMBER (*)    APPearance CLOUDY (*)    Hgb urine dipstick Sakira Dahmer (*)    Ketones, ur 80 (*)    Protein, ur 100 (*)    Nitrite POSITIVE (*)    Leukocytes,Ua Tyge Somers (*)    Bacteria, UA MANY (*)    All other components within normal limits  COMPREHENSIVE METABOLIC PANEL - Abnormal; Notable for the following components:   Glucose, Bld 107 (*)    BUN 26 (*)    Calcium 8.4 (*)    Total Protein 8.2 (*)    Albumin 2.8 (*)    Alkaline Phosphatase 140 (*)    All other components within normal limits  CBC WITH DIFFERENTIAL/PLATELET  LIPASE, BLOOD    EKG None  Radiology DG Abdomen 1 View  Result Date: 11/23/2023 CLINICAL DATA:  Nausea and vomiting EXAM: ABDOMEN - 1 VIEW COMPARISON:  11/22/2023 CT abdomen/pelvis FINDINGS: No dilated Dale Ribeiro bowel loops. Moderate colorectal stool volume, most prominent in the right abdomen. No evidence of pneumatosis or pneumoperitoneum. Clear lung bases. No radiopaque nephrolithiasis. Mild lumbar spondylosis. Partially visualized fixation pin in the proximal right femur. Severe bilateral hip osteoarthritis. IMPRESSION: Nonobstructive bowel gas pattern. Moderate colorectal stool volume, most prominent in the right abdomen, potentially indicating constipation. Electronically Signed   By: Delbert Phenix M.D.   On: 11/23/2023 12:24   CT ABDOMEN PELVIS W CONTRAST  Result Date: 11/22/2023 CLINICAL DATA:  Left lower quadrant pain and nausea vomiting for several days, initial encounter EXAM: CT ABDOMEN AND PELVIS WITH CONTRAST TECHNIQUE: Multidetector CT imaging of the abdomen and pelvis was performed using the standard protocol following bolus administration of intravenous contrast. RADIATION DOSE REDUCTION: This exam was performed  according to the departmental dose-optimization program which includes automated exposure control, adjustment of the mA and/or kV according to patient size and/or use of iterative reconstruction technique. CONTRAST:  75mL OMNIPAQUE IOHEXOL 350 MG/ML SOLN COMPARISON:  None Available. FINDINGS: Lower chest: No acute abnormality. Hepatobiliary: No focal liver abnormality is seen. No gallstones, gallbladder wall thickening, or biliary dilatation. Pancreas: Unremarkable. No pancreatic ductal dilatation or surrounding inflammatory changes. 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 Adrenals/Urinary Tract: Adrenal glands are within normal limits. Kidneys demonstrate a normal enhancement pattern bilaterally. No obstructive changes are seen. The bladder is decompressed. Stomach/Bowel: Scattered fecal material is noted within the colon. No obstructive changes are seen. Diverticulosis without diverticulitis is  noted. The appendix is not well visualized and may have been surgically removed. Alvan Culpepper bowel and stomach are within normal limits. Vascular/Lymphatic: Aortic atherosclerosis. No enlarged abdominal or pelvic lymph nodes. Reproductive: Prostate is unremarkable. Fluid is noted within the vaginal vault. Other: No abdominal wall hernia or abnormality. No abdominopelvic ascites. Musculoskeletal: Postsurgical changes in the proximal right femur are seen. Healed right pubic rami fractures are noted. Degenerative changes of lumbar spine are seen. IMPRESSION: Diverticulosis without diverticulitis. No acute abnormality is noted to correspond with the given clinical history. Electronically Signed   By: Alcide Clever M.D.   On: 11/22/2023 02:15    Procedures Procedures    Medications Ordered in ED Medications  cefTRIAXone (ROCEPHIN) 1 g in sodium chloride 0.9 % 100 mL IVPB (0 g Intravenous Stopped 11/23/23 1413)  sodium chloride 0.9 % bolus 500 mL (0 mLs Intravenous Stopped 11/23/23 1414)   ondansetron (ZOFRAN) injection 4 mg (4 mg Intravenous Given 11/23/23 1346)    ED Course/ Medical Decision Making/ A&P                                 Medical Decision Making Is a 72 year old female, here for persistent abdominal pain, this been going on for the last 3 days.  She was seen about 36 hours ago, and had an normal CT.  She has worsening symptoms currently.  She is lysed abdominal tenderness, without rebound or guarding.  I discussed with Dr. Donnald Garre, we will obtain labs, initially, and a KUB, to check for large fecal retention.  Will start fluids, nausea medicine for nausea vomiting  Amount and/or Complexity of Data Reviewed Labs: ordered.    Details: Labs show nitrate positive urinary tract infection, no evidence of AKI Radiology: ordered.    Details: X-ray shows moderate colorectal stool volume Discussion of management or test interpretation with external provider(s): Patient does have stool in her rectal vault, however it is fairly soft.  It is not appear to be impacted.  I prescribed her MiraLAX, to help her with her constipation.  Additionally she was found to have a urinary tract infection given ceftriaxone, now able to tolerate p.o. intake.  She feels much better, after Zofran and fluids, and looks better.  Discharged her on cefpodoxime, and Zofran for further management.  Discharged with strict return precautions.  Risk Prescription drug management.    Final Clinical Impression(s) / ED Diagnoses Final diagnoses:  Acute cystitis without hematuria  Nausea and vomiting, unspecified vomiting type  Lower abdominal pain    Rx / DC Orders ED Discharge Orders          Ordered    cefpodoxime (VANTIN) 200 MG tablet  2 times daily        11/23/23 1408    ondansetron (ZOFRAN-ODT) 4 MG disintegrating tablet  Every 8 hours PRN        11/23/23 1408    polyethylene glycol (MIRALAX) 17 g packet  Daily        11/23/23 1416              Mairead Schwarzkopf, [place], [place] 11/23/23  1418    Arby Barrette, MD 12/07/23 1840

## 2023-11-23 NOTE — Discharge Instructions (Addendum)
I believe that your cause of nausea, vomiting, is likely from a urinary tract infection, we have given you your first dose of the medication, and you should start taking her medication tomorrow morning.  I have sent to the pharmacy, take this for 7 days.  I have also sent some Zofran, to help with your nausea and vomiting.  Return if you feel like your symptoms are worsening.  I am glad you are feeling better

## 2023-11-23 NOTE — ED Notes (Signed)
[facility] called for pt transport. 

## 2023-11-23 NOTE — ED Notes (Signed)
[facility] transporting pt back to [facility].

## 2023-11-23 NOTE — ED Triage Notes (Signed)
"  From facility, complains of lower abdominal pain, was seen at [facility] and diagnosed with diverticulitis a few days ago. Pain continues. NP at facility said she needed to be reevaluated. Took zofran for nausea at facility this morning. No vomiting" per EMS

## 2023-11-24 ENCOUNTER — Other Ambulatory Visit: Payer: Self-pay | Admitting: *Deleted

## 2023-11-24 NOTE — Patient Outreach (Signed)
Post-Acute Care Manager follow up. Mrs. Montefusco resides in [place] [facility]. Mrs. Kilgore utilized [facility] SNF waiver for admission to [facility].   Previous update received from [place], [place] social worker. Continues to work with therapy. Transition plan likely LTC.   Will continue to follow.   Raiford Noble, MSN, RN, BSN [place]  [facility], [facility] Commun[facility] RN Post- Acute Care Manager Direct Dial: [phone]

## 2023-12-01 ENCOUNTER — Telehealth: Payer: Self-pay

## 2023-12-01 NOTE — Transitions of Care (Post Inpatient/ED Visit) (Deleted)
   12/01/2023  Name: Gabrielle Berry MRN: [id number] DOB: 05/06/1951  Today's TOC FU Call Status: Successful    Patient's Name and Date of Birth confirmed. Yes   Transition Care Management Follow-up Telephone Call    Items Reviewed:    Medications Reviewed Today: Medications Reviewed Today   Medications were not reviewed in this encounter     Home Care and Equipment/Supplies:    Functional Questionnaire:    Follow up appointments reviewed:      SIGNATURE Juliann Olesky RMA

## 2023-12-01 NOTE — Transitions of Care (Post Inpatient/ED Visit) (Signed)
   12/01/2023  Name: Gabrielle Berry MRN: [id number] DOB: 1951-05-07  Today's TOC FU Call Status: Today's TOC FU Call Status:: Successful TOC FU Call Completed TOC FU Call Complete Date: 12/01/23 Patient's Name and Date of Birth confirmed.  Transition Care Management Follow-up Telephone Call Date of Discharge: 11/23/23 Discharge Facility: [facility] [place]) Type of Discharge: Emergency Department Reason for ED Visit: Other: (abdominal pain) How have you been since you were released from the hospital?: Same Any questions or concerns?: Yes Patient Questions/Concerns:: patient is expreincing decrease in appetite; she isn't eating well Patient Questions/Concerns Addressed: Provided Patient Educational Materials  Items Reviewed: Did you receive and understand the discharge instructions provided?: Yes Medications obtained,verified, and reconciled?: Yes (Medications Reviewed) Any new allergies since your discharge?: No Dietary orders reviewed?: No Do you have support at home?: Yes People in Home: child(ren), adult, facility resident (nursing home [facility]) Name of Support/Comfort Primary Source: Therapist, occupational (nursing home)  Medications Reviewed Today: Medications Reviewed Today   Medications were not reviewed in this encounter     Home Care and Equipment/Supplies: Were Home Health Services Ordered?: Yes Name of Home Health Agency:: [facility] Has Agency set up a time to come to your home?: No EMR reviewed for Home Health Orders: Orders present/patient has not received call (refer to CM for follow-up) Any new equipment or medical supplies ordered?: No  Functional Questionnaire: Do you need assistance with bathing/showering or dressing?: Yes Do you need assistance with meal preparation?: Yes Do you need assistance with eating?: Yes Do you have difficulty maintaining continence: Yes Do you need assistance with getting out of bed/getting out of a chair/moving?:  Yes Do you have difficulty managing or taking your medications?: Yes  Follow up appointments reviewed: PCP Follow-up appointment confirmed?: NA (patient is currently in a nusring home; she is unable to come to clinic) Specialist Hospital Follow-up appointment confirmed?: No Reason Specialist Follow-Up Not Confirmed: [facility] Calling Clinician Notified Provider Practice of Needed Appointment Do you need transportation to your follow-up appointment?: Yes Transportation Need Intervention Addressed By:: AMB Referral For SDOH Needs Do you understand care options if your condition(s) worsen?: Yes-patient verbalized understanding    SIGNATURE Moriah Loughry RMA

## 2023-12-11 ENCOUNTER — Other Ambulatory Visit: Payer: Self-pay | Admitting: *Deleted

## 2023-12-11 NOTE — Patient Outreach (Signed)
Post-Acute Care Manager follow up.  Received update from [facility]. Mrs. Duross transitioned to LTC under hospice on 12/08/23.   No identifiable chronic care management needs.   Raiford Noble, MSN, RN, BSN [place]  [facility] RN Post- Acute Care Manager Direct Dial: [phone]

## 2023-12-26 ENCOUNTER — Encounter: Payer: Self-pay | Admitting: Family Medicine

## 2024-01-16 DEATH — deceased
